# Patient Record
Sex: Female | Born: 1985 | Race: Black or African American | Hispanic: No | Marital: Married | Smoking: Former smoker
Health system: Southern US, Community
[De-identification: ages and names within clinical notes are randomized; demographics above are authoritative.]

## PROBLEM LIST (undated history)

## (undated) DIAGNOSIS — F419 Anxiety disorder, unspecified: Secondary | ICD-10-CM

## (undated) DIAGNOSIS — Z9289 Personal history of other medical treatment: Secondary | ICD-10-CM

## (undated) DIAGNOSIS — Z8659 Personal history of other mental and behavioral disorders: Secondary | ICD-10-CM

## (undated) DIAGNOSIS — M797 Fibromyalgia: Secondary | ICD-10-CM

## (undated) DIAGNOSIS — B977 Papillomavirus as the cause of diseases classified elsewhere: Secondary | ICD-10-CM

## (undated) HISTORY — DX: Personal history of other medical treatment: Z92.89

## (undated) HISTORY — PX: HERNIA REPAIR: SHX51

## (undated) HISTORY — DX: Personal history of other mental and behavioral disorders: Z86.59

## (undated) HISTORY — PX: WISDOM TOOTH EXTRACTION: SHX21

## (undated) HISTORY — PX: OVARIAN CYST REMOVAL: SHX89

---
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
## 2007-01-01 ENCOUNTER — Ambulatory Visit: Payer: Self-pay | Admitting: Cardiology

## 2007-01-07 ENCOUNTER — Ambulatory Visit: Payer: Self-pay

## 2007-01-08 ENCOUNTER — Emergency Department (HOSPITAL_COMMUNITY): Admission: EM | Admit: 2007-01-08 | Discharge: 2007-01-09 | Payer: Self-pay | Admitting: Emergency Medicine

## 2007-01-23 ENCOUNTER — Ambulatory Visit: Payer: Self-pay

## 2007-01-23 ENCOUNTER — Encounter: Payer: Self-pay | Admitting: Internal Medicine

## 2007-01-31 ENCOUNTER — Ambulatory Visit: Payer: Self-pay | Admitting: Internal Medicine

## 2007-01-31 LAB — CONVERTED CEMR LAB
BUN: 13 mg/dL (ref 6–23)
Basophils Absolute: 0 10*3/uL (ref 0.0–0.1)
Basophils Relative: 0.2 % (ref 0.0–1.0)
CO2: 30 meq/L (ref 19–32)
Calcium: 9.5 mg/dL (ref 8.4–10.5)
Chloride: 110 meq/L (ref 96–112)
Creatinine, Ser: 0.8 mg/dL (ref 0.4–1.2)
Eosinophils Absolute: 0.1 10*3/uL (ref 0.0–0.6)
Eosinophils Relative: 1 % (ref 0.0–5.0)
GFR calc Af Amer: 119 mL/min
GFR calc non Af Amer: 98 mL/min
Glucose, Bld: 76 mg/dL (ref 70–99)
HCT: 37.6 % (ref 36.0–46.0)
Hemoglobin: 12.5 g/dL (ref 12.0–15.0)
Lymphocytes Relative: 28.1 % (ref 12.0–46.0)
MCHC: 33.4 g/dL (ref 30.0–36.0)
MCV: 89.2 fL (ref 78.0–100.0)
Monocytes Absolute: 1.1 10*3/uL — ABNORMAL HIGH (ref 0.2–0.7)
Monocytes Relative: 13.9 % — ABNORMAL HIGH (ref 3.0–11.0)
Neutro Abs: 4.5 10*3/uL (ref 1.4–7.7)
Neutrophils Relative %: 56.8 % (ref 43.0–77.0)
Platelets: 257 10*3/uL (ref 150–400)
Potassium: 4.1 meq/L (ref 3.5–5.1)
RBC: 4.21 M/uL (ref 3.87–5.11)
RDW: 13.6 % (ref 11.5–14.6)
Sodium: 144 meq/L (ref 135–145)
WBC: 7.9 10*3/uL (ref 4.5–10.5)

## 2007-02-08 ENCOUNTER — Emergency Department (HOSPITAL_COMMUNITY): Admission: EM | Admit: 2007-02-08 | Discharge: 2007-02-08 | Payer: Self-pay | Admitting: Emergency Medicine

## 2007-02-08 ENCOUNTER — Ambulatory Visit: Payer: Self-pay | Admitting: Psychiatry

## 2007-02-08 ENCOUNTER — Inpatient Hospital Stay (HOSPITAL_COMMUNITY): Admission: EM | Admit: 2007-02-08 | Discharge: 2007-02-11 | Payer: Self-pay | Admitting: Psychiatry

## 2007-02-16 ENCOUNTER — Emergency Department (HOSPITAL_COMMUNITY): Admission: EM | Admit: 2007-02-16 | Discharge: 2007-02-16 | Payer: Self-pay | Admitting: Emergency Medicine

## 2007-03-23 ENCOUNTER — Emergency Department (HOSPITAL_COMMUNITY): Admission: EM | Admit: 2007-03-23 | Discharge: 2007-03-23 | Payer: Self-pay | Admitting: Emergency Medicine

## 2007-05-11 ENCOUNTER — Emergency Department (HOSPITAL_COMMUNITY): Admission: EM | Admit: 2007-05-11 | Discharge: 2007-05-11 | Payer: Self-pay | Admitting: Emergency Medicine

## 2007-05-20 ENCOUNTER — Emergency Department (HOSPITAL_COMMUNITY): Admission: EM | Admit: 2007-05-20 | Discharge: 2007-05-20 | Payer: Self-pay | Admitting: Emergency Medicine

## 2007-05-21 ENCOUNTER — Emergency Department (HOSPITAL_COMMUNITY): Admission: EM | Admit: 2007-05-21 | Discharge: 2007-05-22 | Payer: Self-pay | Admitting: Emergency Medicine

## 2007-05-23 ENCOUNTER — Telehealth: Payer: Self-pay | Admitting: Internal Medicine

## 2007-05-30 ENCOUNTER — Emergency Department (HOSPITAL_COMMUNITY): Admission: EM | Admit: 2007-05-30 | Discharge: 2007-05-30 | Payer: Self-pay | Admitting: Family Medicine

## 2007-07-01 ENCOUNTER — Telehealth: Payer: Self-pay | Admitting: Internal Medicine

## 2007-07-08 ENCOUNTER — Emergency Department (HOSPITAL_COMMUNITY): Admission: EM | Admit: 2007-07-08 | Discharge: 2007-07-08 | Payer: Self-pay | Admitting: Emergency Medicine

## 2007-07-08 ENCOUNTER — Telehealth: Payer: Self-pay | Admitting: Internal Medicine

## 2007-07-15 ENCOUNTER — Ambulatory Visit: Payer: Self-pay | Admitting: Cardiology

## 2007-07-22 ENCOUNTER — Encounter: Payer: Self-pay | Admitting: Cardiology

## 2007-07-22 ENCOUNTER — Ambulatory Visit: Payer: Self-pay

## 2007-08-14 ENCOUNTER — Inpatient Hospital Stay (HOSPITAL_COMMUNITY): Admission: AD | Admit: 2007-08-14 | Discharge: 2007-08-14 | Payer: Self-pay | Admitting: Family Medicine

## 2007-09-25 ENCOUNTER — Encounter: Payer: Self-pay | Admitting: Obstetrics & Gynecology

## 2007-09-25 ENCOUNTER — Ambulatory Visit: Payer: Self-pay | Admitting: Obstetrics & Gynecology

## 2007-09-25 ENCOUNTER — Encounter: Payer: Self-pay | Admitting: Internal Medicine

## 2007-10-01 ENCOUNTER — Ambulatory Visit (HOSPITAL_COMMUNITY): Admission: RE | Admit: 2007-10-01 | Discharge: 2007-10-01 | Payer: Self-pay | Admitting: Obstetrics & Gynecology

## 2007-10-23 DIAGNOSIS — Z9289 Personal history of other medical treatment: Secondary | ICD-10-CM

## 2007-10-23 HISTORY — DX: Personal history of other medical treatment: Z92.89

## 2007-11-06 ENCOUNTER — Ambulatory Visit: Payer: Self-pay | Admitting: Family Medicine

## 2007-11-06 ENCOUNTER — Encounter: Payer: Self-pay | Admitting: Internal Medicine

## 2007-12-12 ENCOUNTER — Emergency Department (HOSPITAL_COMMUNITY): Admission: EM | Admit: 2007-12-12 | Discharge: 2007-12-12 | Payer: Self-pay | Admitting: Emergency Medicine

## 2008-01-01 ENCOUNTER — Encounter: Payer: Self-pay | Admitting: Internal Medicine

## 2008-01-17 ENCOUNTER — Emergency Department (HOSPITAL_COMMUNITY): Admission: EM | Admit: 2008-01-17 | Discharge: 2008-01-17 | Payer: Self-pay | Admitting: Emergency Medicine

## 2008-03-05 ENCOUNTER — Telehealth: Payer: Self-pay | Admitting: Internal Medicine

## 2008-03-11 ENCOUNTER — Encounter: Payer: Self-pay | Admitting: Internal Medicine

## 2008-03-31 ENCOUNTER — Inpatient Hospital Stay (HOSPITAL_COMMUNITY): Admission: AD | Admit: 2008-03-31 | Discharge: 2008-03-31 | Payer: Self-pay | Admitting: Obstetrics & Gynecology

## 2008-04-08 ENCOUNTER — Emergency Department (HOSPITAL_COMMUNITY): Admission: EM | Admit: 2008-04-08 | Discharge: 2008-04-08 | Payer: Self-pay | Admitting: Family Medicine

## 2008-04-30 ENCOUNTER — Emergency Department (HOSPITAL_COMMUNITY): Admission: EM | Admit: 2008-04-30 | Discharge: 2008-04-30 | Payer: Self-pay | Admitting: Family Medicine

## 2008-06-05 ENCOUNTER — Emergency Department (HOSPITAL_COMMUNITY): Admission: EM | Admit: 2008-06-05 | Discharge: 2008-06-05 | Payer: Self-pay | Admitting: Family Medicine

## 2008-06-09 ENCOUNTER — Encounter: Admission: RE | Admit: 2008-06-09 | Discharge: 2008-06-09 | Payer: Self-pay | Admitting: Internal Medicine

## 2008-06-17 ENCOUNTER — Ambulatory Visit: Payer: Self-pay | Admitting: Cardiology

## 2008-07-31 ENCOUNTER — Emergency Department (HOSPITAL_BASED_OUTPATIENT_CLINIC_OR_DEPARTMENT_OTHER): Admission: EM | Admit: 2008-07-31 | Discharge: 2008-08-01 | Payer: Self-pay | Admitting: Emergency Medicine

## 2008-09-01 ENCOUNTER — Emergency Department (HOSPITAL_BASED_OUTPATIENT_CLINIC_OR_DEPARTMENT_OTHER): Admission: EM | Admit: 2008-09-01 | Discharge: 2008-09-01 | Payer: Self-pay | Admitting: Emergency Medicine

## 2008-12-01 ENCOUNTER — Ambulatory Visit: Payer: Self-pay | Admitting: Obstetrics and Gynecology

## 2008-12-13 ENCOUNTER — Encounter: Payer: Self-pay | Admitting: Family Medicine

## 2008-12-13 ENCOUNTER — Ambulatory Visit (HOSPITAL_COMMUNITY): Admission: RE | Admit: 2008-12-13 | Discharge: 2008-12-13 | Payer: Self-pay | Admitting: Family Medicine

## 2008-12-13 ENCOUNTER — Ambulatory Visit: Payer: Self-pay | Admitting: Family Medicine

## 2008-12-31 ENCOUNTER — Ambulatory Visit: Payer: Self-pay | Admitting: Obstetrics & Gynecology

## 2009-01-20 ENCOUNTER — Emergency Department (HOSPITAL_COMMUNITY): Admission: EM | Admit: 2009-01-20 | Discharge: 2009-01-21 | Payer: Self-pay | Admitting: Emergency Medicine

## 2009-01-26 ENCOUNTER — Ambulatory Visit: Payer: Self-pay | Admitting: Diagnostic Radiology

## 2009-01-26 ENCOUNTER — Emergency Department (HOSPITAL_BASED_OUTPATIENT_CLINIC_OR_DEPARTMENT_OTHER): Admission: EM | Admit: 2009-01-26 | Discharge: 2009-01-26 | Payer: Self-pay | Admitting: Emergency Medicine

## 2009-02-10 ENCOUNTER — Ambulatory Visit: Payer: Self-pay | Admitting: Family Medicine

## 2009-03-08 ENCOUNTER — Ambulatory Visit (HOSPITAL_COMMUNITY): Admission: RE | Admit: 2009-03-08 | Discharge: 2009-03-08 | Payer: Self-pay | Admitting: Family Medicine

## 2009-03-10 ENCOUNTER — Ambulatory Visit: Payer: Self-pay | Admitting: Obstetrics and Gynecology

## 2009-04-01 IMAGING — CR DG CHEST 1V PORT
1 series · 1 of 1 positions shown · non-contrast
Comparison: 12/28/06.

CLINICAL DATA: Chest pain.  
 PORTABLE CHEST:

[view not recorded]
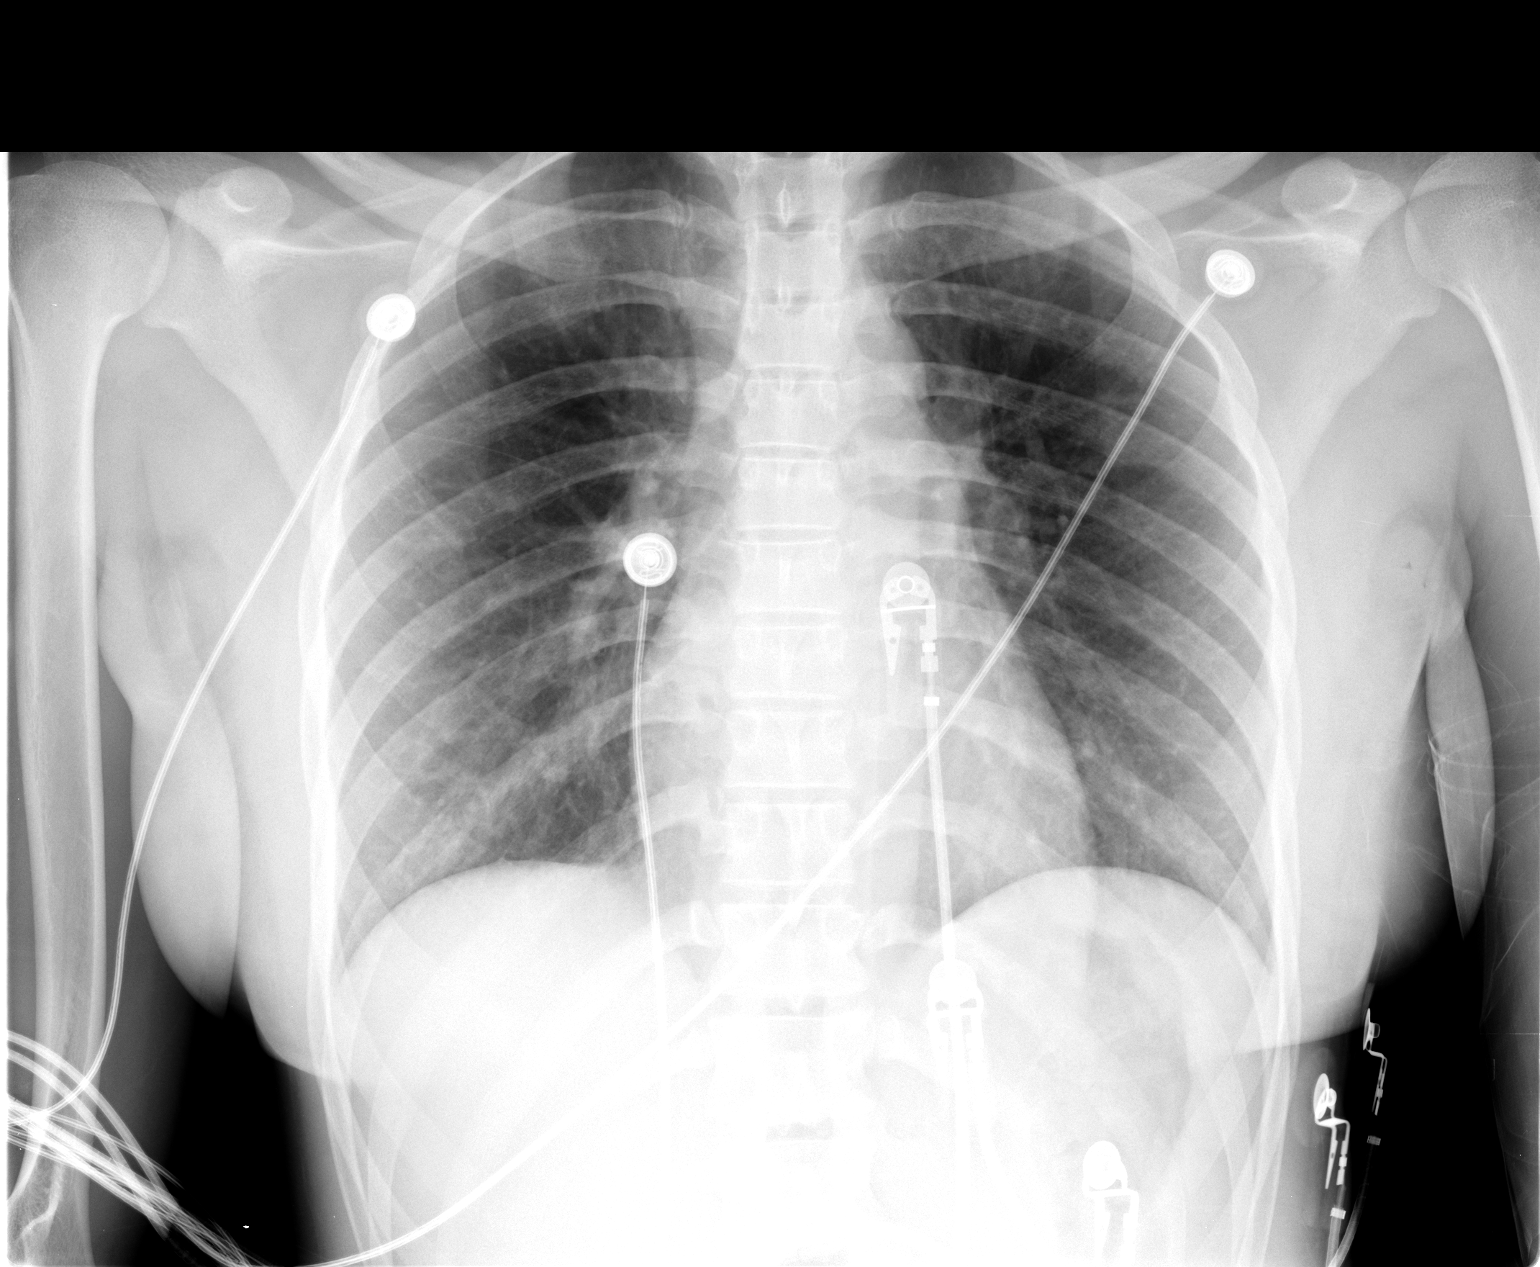

[1 of 1 positions shown; findings below may reference images not displayed]

FINDINGS: Heart size and mediastinal contours are normal.  Both lungs are clear.  There is no evidence of pleural effusion.  No mass or adenopathy identified.
IMPRESSION: No active disease.

## 2009-05-24 ENCOUNTER — Emergency Department (HOSPITAL_COMMUNITY): Admission: EM | Admit: 2009-05-24 | Discharge: 2009-05-25 | Payer: Self-pay | Admitting: Emergency Medicine

## 2009-05-31 ENCOUNTER — Emergency Department (HOSPITAL_BASED_OUTPATIENT_CLINIC_OR_DEPARTMENT_OTHER): Admission: EM | Admit: 2009-05-31 | Discharge: 2009-05-31 | Payer: Self-pay | Admitting: Emergency Medicine

## 2009-06-21 ENCOUNTER — Ambulatory Visit (HOSPITAL_COMMUNITY): Admission: RE | Admit: 2009-06-21 | Discharge: 2009-06-21 | Payer: Self-pay | Admitting: Family Medicine

## 2009-09-02 ENCOUNTER — Emergency Department (HOSPITAL_COMMUNITY): Admission: EM | Admit: 2009-09-02 | Discharge: 2009-09-02 | Payer: Self-pay | Admitting: Emergency Medicine

## 2009-09-14 ENCOUNTER — Emergency Department (HOSPITAL_COMMUNITY): Admission: EM | Admit: 2009-09-14 | Discharge: 2009-09-15 | Payer: Self-pay | Admitting: Emergency Medicine

## 2010-01-08 ENCOUNTER — Ambulatory Visit: Payer: Self-pay | Admitting: Physician Assistant

## 2010-01-08 ENCOUNTER — Inpatient Hospital Stay (HOSPITAL_COMMUNITY): Admission: AD | Admit: 2010-01-08 | Discharge: 2010-01-09 | Payer: Self-pay | Admitting: Family Medicine

## 2010-01-20 ENCOUNTER — Emergency Department (HOSPITAL_COMMUNITY): Admission: EM | Admit: 2010-01-20 | Discharge: 2010-01-20 | Payer: Self-pay | Admitting: Emergency Medicine

## 2010-01-25 ENCOUNTER — Emergency Department (HOSPITAL_COMMUNITY): Admission: EM | Admit: 2010-01-25 | Discharge: 2010-01-26 | Payer: Self-pay | Admitting: Emergency Medicine

## 2010-01-28 ENCOUNTER — Emergency Department (HOSPITAL_COMMUNITY): Admission: EM | Admit: 2010-01-28 | Discharge: 2010-01-28 | Payer: Self-pay | Admitting: Emergency Medicine

## 2010-02-22 ENCOUNTER — Emergency Department (HOSPITAL_COMMUNITY): Admission: EM | Admit: 2010-02-22 | Discharge: 2010-02-23 | Payer: Self-pay | Admitting: Emergency Medicine

## 2010-04-22 ENCOUNTER — Emergency Department (HOSPITAL_BASED_OUTPATIENT_CLINIC_OR_DEPARTMENT_OTHER): Admission: EM | Admit: 2010-04-22 | Discharge: 2010-04-22 | Payer: Self-pay | Admitting: Emergency Medicine

## 2010-04-24 ENCOUNTER — Emergency Department (HOSPITAL_BASED_OUTPATIENT_CLINIC_OR_DEPARTMENT_OTHER): Admission: EM | Admit: 2010-04-24 | Discharge: 2010-04-24 | Payer: Self-pay | Admitting: Emergency Medicine

## 2010-04-26 ENCOUNTER — Emergency Department (HOSPITAL_BASED_OUTPATIENT_CLINIC_OR_DEPARTMENT_OTHER): Admission: EM | Admit: 2010-04-26 | Discharge: 2010-04-26 | Payer: Self-pay | Admitting: Emergency Medicine

## 2010-04-29 ENCOUNTER — Ambulatory Visit: Payer: Self-pay | Admitting: Nurse Practitioner

## 2010-04-29 ENCOUNTER — Inpatient Hospital Stay (HOSPITAL_COMMUNITY): Admission: AD | Admit: 2010-04-29 | Discharge: 2010-04-29 | Payer: Self-pay | Admitting: Obstetrics and Gynecology

## 2010-07-26 ENCOUNTER — Inpatient Hospital Stay (HOSPITAL_COMMUNITY): Admission: AD | Admit: 2010-07-26 | Discharge: 2010-07-26 | Payer: Self-pay | Admitting: Obstetrics & Gynecology

## 2010-08-12 ENCOUNTER — Emergency Department (HOSPITAL_COMMUNITY): Admission: EM | Admit: 2010-08-12 | Discharge: 2010-08-12 | Payer: Self-pay | Admitting: Emergency Medicine

## 2010-09-09 ENCOUNTER — Emergency Department (HOSPITAL_COMMUNITY): Admission: EM | Admit: 2010-09-09 | Discharge: 2010-09-09 | Payer: Self-pay | Admitting: Family Medicine

## 2010-10-02 ENCOUNTER — Ambulatory Visit: Payer: Self-pay | Admitting: Obstetrics and Gynecology

## 2010-10-29 ENCOUNTER — Inpatient Hospital Stay (HOSPITAL_COMMUNITY)
Admission: AD | Admit: 2010-10-29 | Discharge: 2010-10-29 | Payer: Self-pay | Source: Home / Self Care | Attending: Obstetrics & Gynecology | Admitting: Obstetrics & Gynecology

## 2010-11-06 ENCOUNTER — Emergency Department (HOSPITAL_COMMUNITY)
Admission: EM | Admit: 2010-11-06 | Discharge: 2010-11-06 | Payer: Self-pay | Source: Home / Self Care | Admitting: Emergency Medicine

## 2010-11-06 LAB — WET PREP, GENITAL
Clue Cells Wet Prep HPF POC: NONE SEEN
Trich, Wet Prep: NONE SEEN
Yeast Wet Prep HPF POC: NONE SEEN

## 2010-11-06 LAB — POCT PREGNANCY, URINE: Preg Test, Ur: NEGATIVE

## 2010-11-06 LAB — GC/CHLAMYDIA PROBE AMP, GENITAL
Chlamydia, DNA Probe: NEGATIVE
GC Probe Amp, Genital: NEGATIVE

## 2010-11-13 ENCOUNTER — Encounter: Payer: Self-pay | Admitting: *Deleted

## 2010-12-08 ENCOUNTER — Encounter: Payer: Self-pay | Admitting: Advanced Practice Midwife

## 2010-12-21 ENCOUNTER — Other Ambulatory Visit: Payer: Self-pay | Admitting: Obstetrics & Gynecology

## 2011-01-02 LAB — GC/CHLAMYDIA PROBE AMP, GENITAL
Chlamydia, DNA Probe: NEGATIVE
GC Probe Amp, Genital: NEGATIVE

## 2011-01-02 LAB — WET PREP, GENITAL
Trich, Wet Prep: NONE SEEN
Yeast Wet Prep HPF POC: NONE SEEN

## 2011-01-02 LAB — POCT PREGNANCY, URINE: Preg Test, Ur: NEGATIVE

## 2011-01-02 LAB — POCT URINALYSIS DIPSTICK
Bilirubin Urine: NEGATIVE
Ketones, ur: NEGATIVE mg/dL
Specific Gravity, Urine: 1.025 (ref 1.005–1.030)

## 2011-01-03 LAB — WET PREP, GENITAL: Trich, Wet Prep: NONE SEEN

## 2011-01-03 LAB — CBC
HCT: 33.8 % — ABNORMAL LOW (ref 36.0–46.0)
Hemoglobin: 11.4 g/dL — ABNORMAL LOW (ref 12.0–15.0)
MCH: 29.2 pg (ref 26.0–34.0)
MCHC: 33.7 g/dL (ref 30.0–36.0)
RDW: 13.7 % (ref 11.5–15.5)

## 2011-01-03 LAB — URINALYSIS, ROUTINE W REFLEX MICROSCOPIC
Bilirubin Urine: NEGATIVE
Glucose, UA: NEGATIVE mg/dL
Hgb urine dipstick: NEGATIVE
Ketones, ur: NEGATIVE mg/dL
pH: 6.5 (ref 5.0–8.0)

## 2011-01-03 LAB — BASIC METABOLIC PANEL
BUN: 11 mg/dL (ref 6–23)
CO2: 24 mEq/L (ref 19–32)
Calcium: 8.9 mg/dL (ref 8.4–10.5)
Glucose, Bld: 91 mg/dL (ref 70–99)
Sodium: 137 mEq/L (ref 135–145)

## 2011-01-03 LAB — URINE MICROSCOPIC-ADD ON

## 2011-01-03 LAB — DIFFERENTIAL
Basophils Relative: 1 % (ref 0–1)
Eosinophils Absolute: 0.2 10*3/uL (ref 0.0–0.7)
Monocytes Absolute: 1.1 10*3/uL — ABNORMAL HIGH (ref 0.1–1.0)
Monocytes Relative: 13 % — ABNORMAL HIGH (ref 3–12)
Neutrophils Relative %: 58 % (ref 43–77)

## 2011-01-03 LAB — POCT PREGNANCY, URINE: Preg Test, Ur: NEGATIVE

## 2011-01-04 LAB — CBC
HCT: 32.9 % — ABNORMAL LOW (ref 36.0–46.0)
MCHC: 34 g/dL (ref 30.0–36.0)
MCV: 90.6 fL (ref 78.0–100.0)
RDW: 14.4 % (ref 11.5–15.5)

## 2011-01-04 LAB — URINALYSIS, ROUTINE W REFLEX MICROSCOPIC
Bilirubin Urine: NEGATIVE
Hgb urine dipstick: NEGATIVE
Ketones, ur: NEGATIVE mg/dL
Protein, ur: NEGATIVE mg/dL
Urobilinogen, UA: 0.2 mg/dL (ref 0.0–1.0)

## 2011-01-04 LAB — WET PREP, GENITAL: Trich, Wet Prep: NONE SEEN

## 2011-01-07 LAB — DIFFERENTIAL
Eosinophils Absolute: 0.2 10*3/uL (ref 0.0–0.7)
Eosinophils Relative: 2 % (ref 0–5)
Lymphs Abs: 2.8 10*3/uL (ref 0.7–4.0)
Monocytes Relative: 9 % (ref 3–12)

## 2011-01-07 LAB — CBC
Hemoglobin: 12.1 g/dL (ref 12.0–15.0)
MCV: 90.6 fL (ref 78.0–100.0)
Platelets: 250 10*3/uL (ref 150–400)
RBC: 3.98 MIL/uL (ref 3.87–5.11)
WBC: 6.9 10*3/uL (ref 4.0–10.5)

## 2011-01-07 LAB — URINALYSIS, ROUTINE W REFLEX MICROSCOPIC
Bilirubin Urine: NEGATIVE
Glucose, UA: NEGATIVE mg/dL
Ketones, ur: 15 mg/dL — AB
Nitrite: NEGATIVE
Specific Gravity, Urine: 1.025 (ref 1.005–1.030)
pH: 6 (ref 5.0–8.0)

## 2011-01-07 LAB — URINE MICROSCOPIC-ADD ON

## 2011-01-07 LAB — WET PREP, GENITAL
Clue Cells Wet Prep HPF POC: NONE SEEN
Trich, Wet Prep: NONE SEEN

## 2011-01-07 LAB — GC/CHLAMYDIA PROBE AMP, GENITAL: GC Probe Amp, Genital: NEGATIVE

## 2011-01-07 LAB — RPR: RPR Ser Ql: NONREACTIVE

## 2011-01-14 LAB — ABO/RH: ABO/RH(D): O POS

## 2011-01-14 LAB — HCG, QUANTITATIVE, PREGNANCY: hCG, Beta Chain, Quant, S: <2 m[IU]/mL (ref <5)

## 2011-01-14 LAB — WET PREP, GENITAL: Trich, Wet Prep: NONE SEEN

## 2011-01-14 LAB — CBC
RBC: 4.14 MIL/uL (ref 3.87–5.11)
WBC: 7.4 10*3/uL (ref 4.0–10.5)

## 2011-01-24 LAB — POCT I-STAT, CHEM 8
Chloride: 106 mEq/L (ref 96–112)
Glucose, Bld: 91 mg/dL (ref 70–99)
HCT: 38 % (ref 36.0–46.0)
Potassium: 3.5 mEq/L (ref 3.5–5.1)

## 2011-01-24 LAB — CBC
MCHC: 34.2 g/dL (ref 30.0–36.0)
RBC: 4.02 MIL/uL (ref 3.87–5.11)
WBC: 5.1 10*3/uL (ref 4.0–10.5)

## 2011-01-24 LAB — DIFFERENTIAL
Basophils Relative: 1 % (ref 0–1)
Monocytes Relative: 19 % — ABNORMAL HIGH (ref 3–12)
Neutro Abs: 2.4 10*3/uL (ref 1.7–7.7)
Neutrophils Relative %: 47 % (ref 43–77)

## 2011-01-24 LAB — URINALYSIS, ROUTINE W REFLEX MICROSCOPIC
Nitrite: NEGATIVE
Specific Gravity, Urine: 1.021 (ref 1.005–1.030)
Urobilinogen, UA: 1 mg/dL (ref 0.0–1.0)

## 2011-01-24 LAB — POCT PREGNANCY, URINE: Preg Test, Ur: NEGATIVE

## 2011-01-26 ENCOUNTER — Inpatient Hospital Stay (INDEPENDENT_AMBULATORY_CARE_PROVIDER_SITE_OTHER)
Admission: RE | Admit: 2011-01-26 | Discharge: 2011-01-26 | Disposition: A | Discharge status: Home / Self Care | Payer: Self-pay | Source: Ambulatory Visit | Attending: Family Medicine | Admitting: Family Medicine

## 2011-01-26 DIAGNOSIS — L03818 Cellulitis of other sites: Secondary | ICD-10-CM

## 2011-01-27 LAB — COMPREHENSIVE METABOLIC PANEL
AST: 25 U/L (ref 0–37)
Albumin: 4.4 g/dL (ref 3.5–5.2)
Calcium: 9.8 mg/dL (ref 8.4–10.5)
Creatinine, Ser: 0.8 mg/dL (ref 0.4–1.2)
GFR calc Af Amer: >60 mL/min (ref >60)
Total Protein: 8.3 g/dL (ref 6.0–8.3)

## 2011-01-27 LAB — URINALYSIS, ROUTINE W REFLEX MICROSCOPIC
Glucose, UA: NEGATIVE mg/dL
Hgb urine dipstick: NEGATIVE
Leukocytes, UA: NEGATIVE
Nitrite: NEGATIVE
Specific Gravity, Urine: 1.03 (ref 1.005–1.030)
Specific Gravity, Urine: 1.034 — ABNORMAL HIGH (ref 1.005–1.030)
Urobilinogen, UA: 1 mg/dL (ref 0.0–1.0)

## 2011-01-27 LAB — URINE MICROSCOPIC-ADD ON

## 2011-01-27 LAB — POCT PREGNANCY, URINE: Preg Test, Ur: NEGATIVE

## 2011-01-31 LAB — DIFFERENTIAL
Basophils Absolute: 0 10*3/uL (ref 0.0–0.1)
Basophils Relative: 1 % (ref 0–1)
Basophils Relative: 1 % (ref 0–1)
Eosinophils Absolute: 0.1 10*3/uL (ref 0.0–0.7)
Eosinophils Absolute: 0.1 10*3/uL (ref 0.0–0.7)
Eosinophils Relative: 2 % (ref 0–5)
Lymphs Abs: 2.3 10*3/uL (ref 0.7–4.0)
Monocytes Absolute: 0.8 10*3/uL (ref 0.1–1.0)
Monocytes Relative: 14 % — ABNORMAL HIGH (ref 3–12)
Neutrophils Relative %: 51 % (ref 43–77)

## 2011-01-31 LAB — BASIC METABOLIC PANEL
BUN: 13 mg/dL (ref 6–23)
Chloride: 105 mEq/L (ref 96–112)
Creatinine, Ser: 0.86 mg/dL (ref 0.4–1.2)
Glucose, Bld: 84 mg/dL (ref 70–99)

## 2011-01-31 LAB — COMPREHENSIVE METABOLIC PANEL
ALT: 7 U/L (ref 0–35)
AST: 28 U/L (ref 0–37)
Albumin: 3.7 g/dL (ref 3.5–5.2)
Alkaline Phosphatase: 47 U/L (ref 39–117)
BUN: 14 mg/dL (ref 6–23)
CO2: 25 mEq/L (ref 19–32)
Calcium: 8.9 mg/dL (ref 8.4–10.5)
Chloride: 107 mEq/L (ref 96–112)
Creatinine, Ser: 0.9 mg/dL (ref 0.4–1.2)
GFR calc Af Amer: >60 mL/min (ref >60)
GFR calc non Af Amer: >60 mL/min (ref >60)
Glucose, Bld: 77 mg/dL (ref 70–99)
Potassium: 3.8 mEq/L (ref 3.5–5.1)
Sodium: 140 mEq/L (ref 135–145)
Total Bilirubin: 0.3 mg/dL (ref 0.3–1.2)
Total Protein: 6.9 g/dL (ref 6.0–8.3)

## 2011-01-31 LAB — URINALYSIS, ROUTINE W REFLEX MICROSCOPIC
Bilirubin Urine: NEGATIVE
Glucose, UA: NEGATIVE mg/dL
Glucose, UA: NEGATIVE mg/dL
Hgb urine dipstick: NEGATIVE
Hgb urine dipstick: NEGATIVE
Protein, ur: NEGATIVE mg/dL
Specific Gravity, Urine: 1.027 (ref 1.005–1.030)
Specific Gravity, Urine: 1.029 (ref 1.005–1.030)
pH: 7 (ref 5.0–8.0)
pH: 7.5 (ref 5.0–8.0)

## 2011-01-31 LAB — CBC
MCV: 92.4 fL (ref 78.0–100.0)
Platelets: 169 10*3/uL (ref 150–400)
Platelets: 187 10*3/uL (ref 150–400)
RDW: 12.6 % (ref 11.5–15.5)
RDW: 13.5 % (ref 11.5–15.5)
WBC: 6.3 10*3/uL (ref 4.0–10.5)

## 2011-01-31 LAB — GC/CHLAMYDIA PROBE AMP, GENITAL: GC Probe Amp, Genital: NEGATIVE

## 2011-01-31 LAB — WET PREP, GENITAL
Trich, Wet Prep: NONE SEEN
Yeast Wet Prep HPF POC: NONE SEEN

## 2011-01-31 LAB — POCT PREGNANCY, URINE: Preg Test, Ur: NEGATIVE

## 2011-01-31 LAB — PREGNANCY, URINE: Preg Test, Ur: NEGATIVE

## 2011-02-06 LAB — CBC
Hemoglobin: 13.3 g/dL (ref 12.0–15.0)
MCHC: 33.7 g/dL (ref 30.0–36.0)
RDW: 14.5 % (ref 11.5–15.5)

## 2011-02-06 LAB — PREGNANCY, URINE: Preg Test, Ur: NEGATIVE

## 2011-03-06 NOTE — Assessment & Plan Note (Signed)
[facility]                            CARDIOLOGY OFFICE NOTE   [name], [name]                      MRN:          [id number]  DATE:06/17/2008                            DOB:          28-Sep-1986    [name] is a 25 year old female who has previously been seen by Dr.  Diona [name] for chest pain.  Dr. Diona [name] last saw her on July 15, 2007.  She apparently saw him in the past for chest pain, palpitations,  and syncope.  She had an echocardiogram performed for the above reasons  on January 23, 2007.  Her LV function was normal.  There is trivial mitral  regurgitation.  She also apparently had a monitor placed.  The  outpatient monitor showed no significant arrhythmias.  She also  apparently had orthostatic symptoms, but did not have orthostasis  documented in the office on her previous report.  Finally, she had a  stress echocardiogram performed on July 22, 2007.  She exercised  for 6 minutes and 50 seconds on the Bruce protocol.  There was no chest  pain during the study.  There were no arrhythmias noted.  There was no  stress-induced wall motion abnormalities.  She has continued to have  chest pain and asked to be seen for this particular problem today.  She  in particularly, had more pain in the past one and a half weeks.  She  states that it has been continuous without ever completely resolving at  that time.  It is substernal in location.  It is described as a sharp  pain followed by dull sensation.  The pain can increase with exertion,  but it is always there.  It can also increase with lying flat.  It is  not pleuritic.  There is no associated nausea, vomiting or diaphoresis.  She does feel short of breath at times with it.  Because of the above,  she wanted to be seen for further evaluation.  Note, she also has  dyspnea on exertion, but attributes this to her asthma.  There is no  orthopnea, PND or pedal edema.  She has not had  syncope.   MEDICATIONS:  Albuterol as needed.   She has no known drug allergies.   SOCIAL HISTORY:  She does smoke.  She occasionally consumes alcohol.  She denies any drug use.   FAMILY HISTORY:  Positive coronary disease by her report with multiple  members.   PAST MEDICAL HISTORY:  There is no diabetes mellitus, hypertension, or  hyperlipidemia.  She had a previous hernia repair.  She does have a  history of depression.   REVIEW OF SYSTEMS:  She denies any headaches, fevers or chills.  She  does occasionally have a cough.  There is no hemoptysis.  There is no  dysphagia, odynophagia, melena, or hematochezia.  She does state that  she has had diarrhea recently.  There is no dysuria or hematuria.  She  states that her menstrual cycles have increased.  There is no orthopnea,  PND or pedal edema.  There is no claudication noted.  Remaining systems  are negative.   PHYSICAL EXAMINATION:  VITAL SIGNS:  Today shows a blood pressure of  115/80 and her pulse is 64.  She weighs 126 pounds.  GENERAL:  She is well-developed, well-nourished, in no acute distress.  SKIN:  Warm and dry.  She does not appear depressed.  There is no  peripheral clubbing.  BACK:  Normal.  HEENT:  Normal with normal eyelids.  NECK:  Supple with a normal upstroke bilaterally.  No bruits noted.  There is no jugular venous distention.  I cannot appreciate thyromegaly.  CHEST:  Clear to auscultation.  There is no expansion.  CARDIOVASCULAR:  Regular rhythm.  Normal S1 and S2.  There are no  murmurs, rubs or gallops noted.  ABDOMEN:  Nontender, and distended.  Positive bowel sounds.  No  hepatosplenomegaly, no mass appreciated.  There is no abdominal bruit.  She has 2+ femoral pulses bilaterally, no bruits.  EXTREMITIES:  No edema palpating the cords.  She has 2+ posterior tibial  pulses bilaterally.  NEUROLOGY:  Grossly intact.   Her electrocardiogram shows a sinus rhythm at a rate of 64.  There are  no ST  changes noted.   DIAGNOSES:  1. Chest pain - The patient's symptoms are not consistent with a      cardiac etiology.  They have been persistent for the past 8 days      without ever completely resolving.  Her electrocardiogram is      normal.  She had a stress echocardiogram performed in September      2008, that was normal.  I do not think, we need to pursue further      cardiac workup at this point.  Some of her pain may be      musculoskeletal in etiology.  I have asked her to follow up with      Dr. Celene Skeen for this issue.  2. Tobacco abuse - We discussed the importance of discontinuing this,      for between 3-10 minutes.  3. History of asthma - She will continue on her albuterol.   We will see her back on an as-needed basis.     Madolyn Frieze Jens Som, MD, [facility]  Electronically Signed    BSC/MedQ  DD: 06/17/2008  DT: 06/18/2008  Job #: [id number]   cc:   Charlesetta Shanks

## 2011-03-06 NOTE — Group Therapy Note (Signed)
[name], [name]               ACCOUNT NO.:  [id number]   MEDICAL RECORD NO.:  [id number]          PATIENT TYPE:  WOC   LOCATION:  [facility]                   FACILITY:  [facility]   PHYSICIAN:  Caren Griffins, CNM       DATE OF BIRTH:  May 21, 1986   DATE OF SERVICE:  03/10/2009                                  CLINIC NOTE   REASON FOR VISIT:  Here for results.   HISTORY:  This is a 25 year old G1, 0-0-1-0 who has been followed here  for left pelvic pain for which she was initially evaluated at [facility]  ED when her CT showed a left ovarian cyst.  Most recently she was seen  here by Dr. Noel Gerold about a month ago and was scheduled for her follow-up  of the ovarian cyst which was done 2 days ago.  In addition, she was  given Flagyl for BV and started on Sprintec.  Since that visit, she  continues to have left pelvic pain of varying intensity.  She does not  have  irritative vaginal symptoms and she does not have significant  mastodynia anymore.  She relates that her LMP was May, 18, 2010 which  was the correct time for her withdrawal bleed on Sprintec.  Her previous  normal period was September 03, 2009.  In fact, she states she had had a  pattern for around a year and a half of having menorrhagia followed by  about 6 weeks of no bleeding.  Of note, she had a hemoglobin of 12.6 on  January 26, 2009.  She had a normal TSH and normal CMP.  Her ultrasound on  Mar 08, 2009 shows a simple appearing cyst of the left ovary similar in  size  when compared with the prior CT scan, appears physiologic.  The  recommendation is follow up in 6-8 weeks to document resolution of the  cyst.   ASSESSMENT:  Pelvic pain related to left ovarian cyst.  Pain not  improved on  Sprintec.   PLAN:  She is advised to keep a menstrual calendar and continue on her  Sprintec.  She is given a prescription for Vicodin 5-500.  She is to use  1 or 2 p.r.n. q.4-6 hours as necessary for breakthrough pain, if  ibuprofen 800  alternating with Tylenol 650 is ineffective.  She will be  scheduled for follow-up ultrasound in about 7 weeks to look for  resolution of her left ovarian cyst and we will see her again on that  day.           ______________________________  Caren Griffins, CNM     DP/MEDQ  D:  03/10/2009  T:  03/10/2009  Job:  [id number]

## 2011-03-06 NOTE — Op Note (Signed)
[name], [name]               ACCOUNT NO.:  [id number]   MEDICAL RECORD NO.:  [id number]          PATIENT TYPE:  AMB   LOCATION:  [facility]                           [facility]   PHYSICIAN:  Tanya S. Shawnie Pons, M.D.   DATE OF BIRTH:  1986-06-19   DATE OF PROCEDURE:  12/13/2008  DATE OF DISCHARGE:                               OPERATIVE REPORT   PREOPERATIVE DIAGNOSIS:  Cervical spotting plus or minus cervical  lesion.   POSTOPERATIVE DIAGNOSIS:  Cervical spotting plus or minus cervical  lesion.   SURGEON:  Shelbie Proctor. Shawnie Pons, MD   ASSISTANT:  None.   ANESTHESIA:  MAC and local.   FINDINGS:  Large ectropion.   SPECIMENS:  Cervical lead to pathology.   ESTIMATED BLOOD LOSS:  Minimal.   COMPLICATIONS:  None known.   REASON FOR PROCEDURE:  Briefly, the patient is a 25 year old nullipara  who has been seen in our office at least 2 different times with chronic  vaginal spotting.  She is noted to have a large ectropion which is very  friable and seems to bleed quite a bit.  This lesion was again noted.  At the time of her [facility], [facility],  she is referred over again.  Her Pap smear from that visit is noted to  be normal.  However, given that she continues to have problems with  continued bleeding, it was felt maybe the patient would benefit from  lead to see if this helps the ectropion heal better and become less  friable.   PROCEDURE:  The patient was taken to the OR.  She was placed in dorsal  lithotomy in [place] stirrups.  She was prepped and draped in the usual  sterile fashion.  A speculum was placed.  Cervix was visualized.  There  was a large ectropion noted that it was not just posterior, but on the  anterior edge of the cervix as well.  It was friable, did bleed with  contact with the speculum.  After application of acetic acid, it really  was not very much in the way of acetowhite areas noted, but given her  continued problems of  bleeding, it was felt, the patient best to be  served by LEEP procedure.  The speculum was then removed and a Teflon-  coated speculum was placed inside the cervix.  Suction was hooked up and  a cervical block was done with 20 mL of lidocaine with epinephrine.  Once this was completed a medium-sized apple fissure cone was used to  take a LEEP specimen.  Once this was removed, the bed  of the LEEP was cauterized with electrocautery ball.  Excellent  hemostasis was noted but Monsel was placed intraoperatively.  Again,  excellent hemostasis was noted and all instruments were removed from the  vagina.  All instrument and lap counts were x2.  The patient was  awakened and taken to recovery room in stable condition.      Shelbie Proctor. Shawnie Pons, M.D.  Electronically Signed     TSP/MEDQ  D:  12/13/2008  T:  12/14/2008  Job:  [id number]
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
## 2011-03-06 NOTE — Assessment & Plan Note (Signed)
[facility]                            CARDIOLOGY OFFICE NOTE   LANDIS, CASSARO                      MRN:          [id number]  DATE:07/15/2007                            DOB:          04/30/1986    PRIMARY CARE PHYSICIAN:  Dr. Berniece Andreas.   HISTORY OF PRESENT ILLNESS:  I saw [name] in consultation back in  March of this year.  Please see my full consultation note for details.  She had had symptoms of atypical chest pain as well as intermittent  syncope and a reported problem with anxiety and palpitations.  Her  baseline electrocardiogram reveals no marked abnormalities.  I referred  her for an echocardiogram which was performed in April of this year  demonstrating normal left ventricular systolic function and no major  valvular abnormalities.  I also referred her for an outpatient cardiac  monitor.  With symptoms such as lightheadedness and chest pain, no  significant arrhythmias were noted.  It was suggested that she come back  for a followup, although she was a no-show to the clinic.  I have not  seen her since that time.   [name] is now referred back to the office with continued similar  symptomatology.  She describes intermittent unpredictable sharp chest  pains.  These are not clearly exertional.  She has not had frank syncope  but continues to report a feeling of lightheadedness sometimes when she  stands up.  We had orthostatic measurements made in the office today  which did not reveal any significant orthostasis.  Her heart rate  increased somewhat from 57 to 72 going from lying supine to standing  position after 5 minutes, although her systolic blood pressure went from  122 to 130.  She did report a feeling of dizziness and black spots in  her eyes while she was standing despite no significant orthostatic  change.  Her electrocardiogram today shows sinus rhythm with R primes in  leads V1 and V2, as noted previously, as well as  nonspecific anterior T  wave changes.  She does have some T wave inversions in lead V3 through  V5 which are new in comparison to the previous tracing from March.  I  note that the patient was admitted back in April with suicidal thoughts.  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  She tells me that she is scheduled to see a  psychologist in the next week.   I had a discussion with Ms. Dehner today.  It is very unlikely that she  has a primary cardiac etiology for her symptoms.  We have not as yet  documented any significant dysrhythmias.  She could certainly have a  neurocardiogenic mechanism to her syncope, in fact, this would be the  most common reason for this.  Structurally, her heart was normal by  echocardiography.  She has not had frank ischemic evaluation, although  this would be very unusual in a 25 year old woman.  We did talk about  doing a basic exercise treadmill test to  clear the air on this issue.  I  suspect that her EKG changes are nonspecific.   ALLERGIES:  No known drug allergies.   PRESENT MEDICATIONS:  Lamictal 25 mg p.o. daily.   REVIEW OF SYSTEMS:  As described in the History of Present Illness.   EXAMINATION:  Blood pressure 98/70, heart rate 59, weight 127 pounds,  otherwise orthostatic measurements as noted in the chart.  A normally  nourished appearing woman in no acute distress.  HEENT:  Normal.  NECK:  Supple.  No elevated jugular venous pressure, no loud bruits, no  thyromegaly.  LUNGS:  Clear.  Nonlabored breathing.  CARDIAC EXAM:  A regular rate and rhythm.  No loud murmur or S3 gallop.  ABDOMEN:  Soft, normal bowel sounds, no tenderness.  EXTREMITIES:  No pitting edema.  Skin warm and dry.  Distal pulses 2+.  MUSCULOSKELETAL:  No kyphosis is noted.  NEURO/PSYCHIATRIC:  The patient is alert and oriented x3, affect seems  appropriate today.   IMPRESSION AND RECOMMENDATIONS:  1. Atypical  chest pain, intermittent dizziness, and occasional syncope      as outlined previously.  So far cardiac assessment has been      reassuring including a structurally normal echocardiogram and no      specific dysrhythmias documented with cardiac monitoring in the      setting of symptomatology.  The patient is also not noted to be      orthostatic, also complaining of dizziness when she stands.  I      think the likelihood of underlying ischemia is quite low in this 25-      year-old woman.  She does have some T wave changes in the      anterolateral leads which are likely nonspecific.  We talked about      doing an exercise treadmill test to exclude ischemia, on the one      hand (doubt), although also ensure that she does not have any      exercise-induced arrhythmias.  If this is normal I do not      anticipate any additional cardiac evaluation and she can continue      to follow up with Dr. Fabian Sharp and her psychologist.  Otherwise, no      new medications are anticipated at this time.  2. Will inform the patient of her test results.     Jonelle Sidle, MD  Electronically Signed    SGM/MedQ  DD: 07/15/2007  DT: 07/15/2007  Job #: [id number]   cc:   Neta Mends. Fabian Sharp, MD

## 2011-03-06 NOTE — Group Therapy Note (Signed)
NAMEKORIE, STREAT               ACCOUNT NO.:  [id number]   MEDICAL RECORD NO.:  [id number]          PATIENT TYPE:  WOC   LOCATION:  [facility]                   FACILITY:  [facility]   PHYSICIAN:  Argentina Donovan, MD        DATE OF BIRTH:  1986-03-25   DATE OF SERVICE:  02/10/2009                                  CLINIC NOTE   CHIEF COMPLAINT:  Pelvic pain and breast tenderness.   HISTORY OF PRESENT ILLNESS:  Ms. Wisser is a 25 year old gravida 1, para  0-0-1-0, who presents complaining of a several-month course of left-  sided lower pelvic pain.  Additionally, she relates a 54-month course of  breast fullness and tenderness.  She denies any significant vaginal  discharge.  She states that she has been in a monogenous relationship  with other female for an extended period time and states there is not  any possibility that  she could be pregnant.  She relates no breast  discharge or any palpable masses, as far she has noticed.  She was seen  in the emergency room at the beginning of this month and a CT of her  abdomen and pelvis revealed a 4 cm x 3 cm simple cyst in the left adnexa  with a tiny amount of free fluid.  No other abnormalities were found on  that study.  The evaluation during that ER visit included a negative  urinalysis, and a wet prep that showed clue cells, which apparently the  patient was not treated for.   PAST MEDICAL HISTORY/MEDICATIONS:  The past medical history was  reviewed.  The patient takes Lexapro and Depakote for bipolar disorder.   PAST SURGICAL HISTORY:  The patient had hernia repairs as a young child.   OBSTETRICAL HISTORY:  The patient relates she has had a miscarriage at  the age of 25 years old.   PAST GYN HISTORY:  She has not had an abnormal Pap smear; however, she  had a cervical lesion with a LEEP done in the operating room.  The  results of that revealed erosive cervicitis without any atypia.   Her medications are reviewed and reconciled in the  patient's chart.   SOCIAL HISTORY:  The patient denies tobacco or alcohol use.  She does  relate to moderate caffeine intake.   PHYSICAL EXAMINATION:  VITAL SIGNS:  Her vital signs are reviewed and  are normal.  The patient is 5 feet 5 inches tall and weighs 143 pounds.  GENERAL:  She is healthy-appearing, in no apparent distress.  BREASTS:  A breast exam was performed and revealed no dominant masses.  Fibrocystic breast tissue is noted bilaterally.  There is no nipple  discharge.  The was no axillary adenopathy.  ABDOMEN:  Her abdomen was soft, normoactive bowel sounds.  The patient  related that she had some left lower quadrant tenderness; however, this  is not very remarkable on my exam.  There is no rebound or guarding  noted.  No organomegaly noted.  GENITOURINARY:  External female genitalia is normal-appearing.  The  vaginal mucosa revealed normal rugae.  Cervix was nulliparous-appearing  without a lesion.  There was a moderate amount of a yellowish vaginal  discharge.  BIMANUAL EXAM:  Was performed, which the patient did not tolerate very  well and revealed no fullness in the left adnexa; however, the patient  relates tenderness in that area.  No fullness or tenderness at the right  adnexa.  The uterus was normal size and shape.  A wet prep was done  which revealed numerous clue cells.   ASSESSMENT/PLAN:  1. Pelvic pain, of unclear etiology:  Her pain may be related to an      ovarian cyst; however, given the patient's social and psychiatric      history, some of the pain could be of a supratentorial origin.  We      decided to do a trial of Sprintec, oral contraceptive pills.  This      would be effective if there was a contribution as stated by the      ovarian cyst.  Additionally, an ultrasound was scheduled for later      in May to re-evaluate the ovarian cyst.  2. Bacterial vaginosis:  Flagyl 500 mg b.i.d. for 7 days given.  The      patient relates that she has had  several of these infections.  She      states she does not really like yogurt, so I recommended her to buy      some over-the-counter her probiotic supplements.  3. Mastodynia, of unclear etiology:  Given that the patient's young      age, no masses noted on exam, no discharge, that her pain could be      related to a hormonal thing, related to her menstrual cycle or      perhaps as a supratentorial complex as well.     ______________________________  Odie Sera, D.O.    ______________________________  Argentina Donovan, MD    MC/MEDQ  D:  02/10/2009  T:  02/10/2009  Job:  [id number]
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
## 2011-03-09 NOTE — Assessment & Plan Note (Signed)
[facility]                            CARDIOLOGY OFFICE NOTE   [name], [name]                      MRN:          [id number]  DATE:01/01/2007                            DOB:          1986-02-20    PRIMARY CARE PHYSICIAN:  [name], M.D.   REASON FOR CONSULTATION:  History of syncope.   HISTORY OF PRESENT ILLNESS:  [name] is an 25 year old young woman  with no reported major medical conditions who states that she has a  history of anxiety disorder and has recently manifested symptoms  including chest pain, body numbness, hyperventilation, changes in colors  of her fingers, headaches and syncope. She is present today with her  stepmother. She states that her typical anxiety attacks include a  racing heart as well as feeling of trembling that may last anywhere  from 20 minutes to hours at time. Reportedly unrelated to this has been  other episodes which she states start off with a [name]/stabbing chest  pain that may last up to 15 minutes followed by a feeling of body  numbness and then general hyperventilation with purple fingers and  headache. With these episodes which have occurred over the last month,  she has had two episodes of syncope, one approximately three weeks ago  when she was standing at a desk at work and one while she was seated at  home that occurred a few days ago. She denies ever having any similar  problems to this in the past. She had an electrocardiogram obtained by  Dr. Fabian [name] back on February 20 which demonstrated sinus rhythm with a  RSR prime pattern in leads V1 and V2/incomplete right bundle branch  block but normal QT interval and no clear evidence of preexcitation. Her  repeat tracing today shows similar pattern, again with a normal QT  interval, no obvious preexcitation and RSR prime patterns in V1 and V2.  No clear morphology of Brugada syndrome is evident, and I did review  this tracing with Dr. Ladona [name].  As far as family history, [name] states  that she has a brother and sister who both have problems with heart  fluttering and hypertension. The patient's sister is 64, and her  brother is 77, and [name] states that they are both on medications  for this. She denies any family history of sudden cardiac death,  however. She has had no prior cardiac testing other than  electrocardiogram and is here for further assessment today.   ALLERGIES:  No known drug allergies.   PRESENT MEDICATIONS:  None.   SOCIAL HISTORY:  The patient is single, has no children. Has a previous  history of tobacco use but quit in September of 2006. Denies any illicit  substance use. Drinks alcohol beverages occasionally. She is not  exercising regularly at this point. She works as a Chief of Staff for a [employer]   FAMILY HISTORY:  Is as outlined above. The patient states that her  grandmother died in her 66s with COPD, type 2 diabetes mellitus and  hypertension. She also has an aunt with  heart problems and obesity, a  sister at age 44 and brother at age 57 with history of heart  fluttering and hypertension - both on medications.   REVIEW OF SYSTEMS:  As described in the history of present illness. She  has had seasonal allergies and fatigue.   Prior surgical history includes previous umbilical hernia repair in  1997.   PHYSICAL EXAMINATION:  Weight is 130 pounds, blood pressure 112/80,  heart rate is 67.  This is a normally nourished-appearing woman in no acute distress.  HEENT:  Conjunctivae and lids normal. Oropharynx clear.  NECK:  Supple without elevated jugular venous pressure, without bruits.  No thyromegaly is noted.  LUNGS:  Are clear without labored breathing.  CARDIAC EXAM:  Reveals a regular rate and rhythm. No loud murmur or S3  gallop. No pericardial rub.  ABDOMEN:  Soft. No obvious hepatomegaly. No bruits. Nontender.  EXTREMITIES:  No pitting edema. Distal pulses are  2+.  SKIN:  Warm and dry.  MUSCULOSKELETAL:  No kyphosis is noted.  NEUROPSYCHIATRIC:  The patient is oriented x3.   IMPRESSION AND RECOMMENDATIONS:  1. Symptom complex as outlined above including atypical chest pain and      syncope. There does not appear to be any specific trigger for this,      and she also reports a history of anxiety with racing heart and      feeling of tremulousness. Her resting electrocardiogram essentially      shows a RSR prime pattern in V1 and V2/incomplete right bundle      branch block but no clear evidence of preexcitation, a normal QT      interval and no clear morphology to indicate Brugada syndrome. I      discussed this with the patient and her stepmother today. We      reviewed a variety of options for further testing, and at this      point, have elected to proceed with a resting 2-D echocardiogram to      assess cardiac structure and function as well as an event monitor      to document for any potential dysrhythmias. I will then have her      followup over the next month, and we can review these results. She      is not relating any specific exertional symptoms, so we have not      pursued any stress testing at this point.  2. Further plans to follow.     Gabriela Sidle, MD  Electronically Signed    SGM/MedQ  DD: 01/01/2007  DT: 01/03/2007  Job #: [id number]   cc:   Gabriela Mends. Gabriela Sharp, MD

## 2011-03-09 NOTE — Discharge Summary (Signed)
[name], [name]               ACCOUNT NO.:  [id number]   MEDICAL RECORD NO.:  [id number]          PATIENT TYPE:  IPS   LOCATION:  0304                          [facility]ILITY[facility]   PHYSICIAN:  Anselm Jungling, MD  DATE OF BIRTH:  1986/05/15   DATE OF ADMISSION:  02/08/2007  DATE OF DISCHARGE:  02/11/2007                               DISCHARGE SUMMARY   INDENTIFYING DATA AND REASON FOR ADMISSION:  The patient is a 25-year-  old single female who had presented at the [facility] Emergency  Department, reporting that she had been having suicidal thoughts.  She  reported that she had had an upsetting conversation with her father  during the previous week and had attempted suicide multiple times.  She  had a history of anxiety disorder.  Please refer to the admission note  for further details pertaining to the symptoms, circumstances and  history that led to her hospitalization.  She was given an initial Axis  I diagnosis of major depressive disorder and post-traumatic stress  disorder.   MEDICAL AND LABORATORY:  The patient was medically cleared in the  emergency department, and then medically and physically reassessed by  the psychiatric physician's assistant upon arrival to the inpatient  psychiatric unit.  She came to [place] with a history of syncope, and had  recently finished evaluation using an event monitor, and had a followup  appointment scheduled for April 28.   There were no significant medical issues during this brief inpatient  psychiatric stay.   HOSPITAL COURSE:  The patient was admitted to the adult inpatient  psychiatric service.  She presented as a well-nourished, well-developed  young woman who was depressed, but pleasant and cooperative.  She  participated in various therapeutic groups and activities.  During the  initial portion of her inpatient stay, she was seen and cared for by Dr.  Milford Cage.  Dr. Katrinka Blazing began the trial of Lamictal for presumed  underlying  bipolar depression.   By the 4th hospital day, the patient appeared appropriate for discharge,  having been absent suicidal ideation throughout her stay, and appearing  to tolerate Lamictal well.  She agreed to the following aftercare plan.   AFTERCARE:  The patient was to follow up with Dr. Nolen Mu for  medication management with an appointment on Feb 26, 2007, and Ollen Gross, therapist, with an appointment on February 17, 2007.   DISCHARGE MEDICATIONS:  Lamictal 25 mg daily.   DISCHARGE DIAGNOSES:  AXIS I:  Bipolar disorder, type 2, currently  depressed.  AXIS II:  Deferred.  AXIS III:  History of syncope, unclear etiology.  AXIS IV:  Stressors severe.  AXIS V:  Global assessment of functioning on discharge 65.     Anselm Jungling, MD  Electronically Signed    SPB/MEDQ  D:  02/17/2007  T:  02/17/2007  Job:  [id number]

## 2011-03-09 NOTE — Assessment & Plan Note (Signed)
[facility]                                 ON-CALL NOTE   [name], [name]                        MRN:          [id number]  DATE:01/08/2007                            DOB:          11-Apr-1986    PRIMARY CARE PHYSICIAN:  Dr. Neta Mends. Panosh.   PRIMARY CARDIOLOGIST:  Dr. Simona Huh.   I received a page from the answering service concerning [name],  stating that she was complaining of shortness of breath and dizziness.  A phone number was left for me to return her call at [phone] and  speak with someone by the name of [name].  The phone number that  I called, they did not answer or it was busy.  The answering service  also called to confirm that the patient did have a followup phone call  from myself and they were unable to get through on that number as well.  I have made several attempts to return her call but have not been able  to get through, either because the patient did not answer the phone, or  the phone line was busy.   The patient was last seen by Dr. Simona Huh on January 01, 2007 with  similar episodes of anxiety, two episodes of syncope, and some  dizziness.  The patient had scheduled an echocardiogram through Dr.  Diona Browner but is due to follow up with him in one month.   I have not made any further attempts to return this patient's call.  If  she does page again I will be happy to follow up with a phone call in  return.      Bettey Mare. Lyman Bishop, NP  Electronically Signed      Noralyn Pick. Eden Emms, MD, [facility]  Electronically Signed   KML/MedQ  DD: 01/08/2007  DT: 01/09/2007  Job #: [id number]   cc:   Neta Mends. Fabian Sharp, MD
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
## 2011-03-12 IMAGING — CT CT PELVIS W/ CM
1 series · 1 of 2 positions shown · IV contrast (agent unspecified)
Comparison: None

CT ABDOMEN

CLINICAL DATA: Left-sided abdominal and pelvic pain.

CT ABDOMEN AND PELVIS WITH CONTRAST
TECHNIQUE: Multidetector CT imaging of the abdomen and pelvis was
performed using the standard protocol following bolus
administration of intravenous contrast.
Contrast: 100 ml Emnipaque-2TT and oral contrast

[Series 1: topogram 0.6 t20s · coronal · 0.6mm · 1.00mm/px · 1 of 2 slices shown]
[im 2/2]
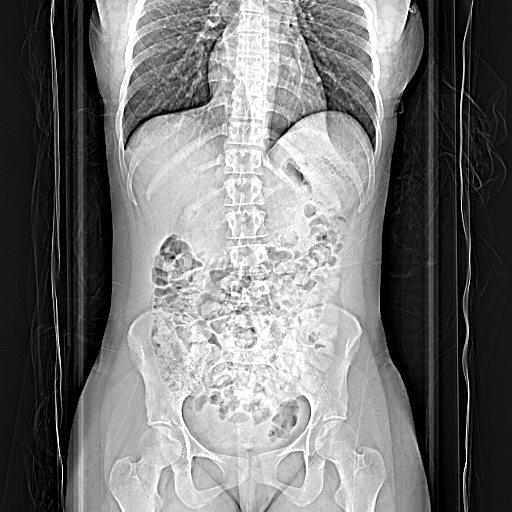

[1 of 2 positions shown; findings below may reference images not displayed]

FINDINGS: The abdominal parenchymal organs are normal in
appearance.  Gallbladder is unremarkable.  There is no evidence of
hydronephrosis.

There is no evidence of soft tissue mass or lymphadenopathy.  No
inflammatory process or abnormal fluid collections identified.
IMPRESSION: Negative abdomen CT.

CT PELVIS
FINDINGS: A simple appearing cyst is seen in the left adnexa which
measures 4.0 x 3.0 cm.  This most likely represents a physiologic
ovarian cyst.  A tiny amount of free fluid is noted in the right
pelvic cul-de-sac, which is likely physiologic.

No other pelvic masses are identified.  There is no evidence of
inflammatory process or abscess.  There is no evidence of dilated
bowel loops.
IMPRESSION: 4 cm left ovarian cyst and tiny amount of free fluid, which are
likely physiologic.

## 2011-04-14 ENCOUNTER — Inpatient Hospital Stay (HOSPITAL_COMMUNITY)
Admission: AD | Admit: 2011-04-14 | Discharge: 2011-04-14 | Disposition: A | Discharge status: Home / Self Care | Payer: Self-pay | Source: Ambulatory Visit | Attending: Obstetrics & Gynecology | Admitting: Obstetrics & Gynecology

## 2011-04-14 DIAGNOSIS — B9689 Other specified bacterial agents as the cause of diseases classified elsewhere: Secondary | ICD-10-CM | POA: Insufficient documentation

## 2011-04-14 DIAGNOSIS — N760 Acute vaginitis: Secondary | ICD-10-CM

## 2011-04-14 DIAGNOSIS — A499 Bacterial infection, unspecified: Secondary | ICD-10-CM | POA: Insufficient documentation

## 2011-04-14 DIAGNOSIS — J45909 Unspecified asthma, uncomplicated: Secondary | ICD-10-CM | POA: Insufficient documentation

## 2011-04-14 LAB — URINALYSIS, ROUTINE W REFLEX MICROSCOPIC
Bilirubin Urine: NEGATIVE
Hgb urine dipstick: NEGATIVE
Ketones, ur: NEGATIVE mg/dL
Nitrite: NEGATIVE
pH: 6 (ref 5.0–8.0)

## 2011-04-14 LAB — WET PREP, GENITAL

## 2011-04-14 LAB — POCT PREGNANCY, URINE: Preg Test, Ur: NEGATIVE

## 2011-04-17 LAB — GC/CHLAMYDIA PROBE AMP, GENITAL
Chlamydia, DNA Probe: NEGATIVE
GC Probe Amp, Genital: NEGATIVE

## 2011-06-22 ENCOUNTER — Inpatient Hospital Stay (HOSPITAL_COMMUNITY): Payer: Self-pay

## 2011-06-22 ENCOUNTER — Inpatient Hospital Stay (HOSPITAL_COMMUNITY)
Admission: AD | Admit: 2011-06-22 | Discharge: 2011-06-22 | Disposition: A | Discharge status: Home / Self Care | Payer: Self-pay | Source: Ambulatory Visit | Attending: Obstetrics | Admitting: Obstetrics

## 2011-06-22 ENCOUNTER — Encounter (HOSPITAL_COMMUNITY): Payer: Self-pay | Admitting: *Deleted

## 2011-06-22 DIAGNOSIS — N898 Other specified noninflammatory disorders of vagina: Secondary | ICD-10-CM | POA: Insufficient documentation

## 2011-06-22 DIAGNOSIS — N939 Abnormal uterine and vaginal bleeding, unspecified: Secondary | ICD-10-CM

## 2011-06-22 DIAGNOSIS — F411 Generalized anxiety disorder: Secondary | ICD-10-CM | POA: Insufficient documentation

## 2011-06-22 DIAGNOSIS — B977 Papillomavirus as the cause of diseases classified elsewhere: Secondary | ICD-10-CM | POA: Insufficient documentation

## 2011-06-22 DIAGNOSIS — F17200 Nicotine dependence, unspecified, uncomplicated: Secondary | ICD-10-CM | POA: Insufficient documentation

## 2011-06-22 DIAGNOSIS — N949 Unspecified condition associated with female genital organs and menstrual cycle: Secondary | ICD-10-CM | POA: Insufficient documentation

## 2011-06-22 HISTORY — DX: Anxiety disorder, unspecified: F41.9

## 2011-06-22 HISTORY — DX: Papillomavirus as the cause of diseases classified elsewhere: B97.7

## 2011-06-22 LAB — URINALYSIS, ROUTINE W REFLEX MICROSCOPIC
Ketones, ur: NEGATIVE mg/dL
Leukocytes, UA: NEGATIVE
Nitrite: NEGATIVE
Protein, ur: NEGATIVE mg/dL
Urobilinogen, UA: 0.2 mg/dL (ref 0.0–1.0)
pH: 6 (ref 5.0–8.0)

## 2011-06-22 LAB — CBC
MCH: 30.4 pg (ref 26.0–34.0)
MCHC: 33.4 g/dL (ref 30.0–36.0)
Platelets: 226 10*3/uL (ref 150–400)
RDW: 14.2 % (ref 11.5–15.5)

## 2011-06-22 LAB — WET PREP, GENITAL
Trich, Wet Prep: NONE SEEN
Yeast Wet Prep HPF POC: NONE SEEN

## 2011-06-22 LAB — URINE MICROSCOPIC-ADD ON

## 2011-06-22 MED ORDER — MEDROXYPROGESTERONE ACETATE 10 MG PO TABS: 10.0000 mg | ORAL_TABLET | Freq: Every day | ORAL | Status: DC

## 2011-06-22 MED ORDER — KETOROLAC TROMETHAMINE 60 MG/2ML IM SOLN
60.0000 mg | Freq: Once | INTRAMUSCULAR | Status: AC
  Administered 2011-06-22: 60 mg via INTRAMUSCULAR
  Filled 2011-06-22: qty 2

## 2011-06-22 MED ORDER — NAPROXEN SODIUM 550 MG PO TABS: 550.0000 mg | ORAL_TABLET | Freq: Two times a day (BID) | ORAL | Status: DC

## 2011-06-22 NOTE — ED Provider Notes (Signed)
History   Pt presents today c/o irregular vag bleeding and left lower quad pain. She states she had a NL menses at the beginning of August and then her bleeding stopped for about 1wk. Since that time, she has been bleeding heavily on a daily basis and has had increasing pain in her LLQ. She denies fever, vag irritation, or any other sx at this time.  Chief Complaint  Patient presents with  . Vaginal Bleeding   HPI  OB History    Grav Para Term Preterm Abortions TAB SAB Ect Mult Living   1    1  1          Past Medical History  Diagnosis Date  . HPV in female   . Anxiety     Past Surgical History  Procedure Date  . Hernia repair   . Ovarian cyst removal   . Wisdom tooth extraction     No family history on file.  History  Substance Use Topics  . Smoking status: Current Everyday Smoker -- 0.2 packs/day  . Smokeless tobacco: Not on file  . Alcohol Use: No    Allergies:  Allergies  Allergen Reactions  . Morphine And Related Other (See Comments)    Patient says it burns her skin and causes blisters  . Lamictal (Lamotrigine) Rash  . Latex Rash    Prescriptions prior to admission  Medication Sig Dispense Refill  . ibuprofen (ADVIL,MOTRIN) 200 MG tablet Take 200 mg by mouth every 6 (six) hours as needed. For cramping         Review of Systems  Constitutional: Negative for fever.  Gastrointestinal: Positive for abdominal pain. Negative for nausea, vomiting, diarrhea and constipation.  Genitourinary: Negative for dysuria, urgency, frequency and hematuria.  Neurological: Negative for dizziness and headaches.  Psychiatric/Behavioral: Negative for depression and suicidal ideas.   Physical Exam   Blood pressure 114/76, pulse 67, temperature 98.3 F (36.8 C), temperature source Oral, resp. rate 18, height 5\' 4"  (1.626 m), weight 133 lb 12.8 oz (60.691 kg), last menstrual period 05/27/2011.  Physical Exam  Constitutional: She is oriented to person, place, and time. She  appears well-developed and well-nourished. No distress.  HENT:  Head: Normocephalic and atraumatic.  Eyes: EOM are normal. Pupils are equal, round, and reactive to light.  GI: Soft. She exhibits no distension and no mass. There is tenderness. There is no rebound and no guarding.  Genitourinary: There is bleeding around the vagina. No vaginal discharge found.       Uterus is NL size and shape. There is tenderness in the left adnexa. No obvious masses noted.  Neurological: She is alert and oriented to person, place, and time.  Skin: Skin is warm and dry. She is not diaphoretic.  Psychiatric: She has a normal mood and affect. Her behavior is normal. Judgment and thought content normal.    MAU Course  Procedures  Wet prep and GC/Chlamydia cultures done.  Results for orders placed during the hospital encounter of 06/22/11 (from the past 24 hour(s))  URINALYSIS, ROUTINE W REFLEX MICROSCOPIC     Status: Abnormal   Collection Time   06/22/11  1:40 PM      Component Value Range   Color, Urine YELLOW  YELLOW    Appearance CLEAR  CLEAR    Specific Gravity, Urine 1.010  1.005 - 1.030    pH 6.0  5.0 - 8.0    Glucose, UA NEGATIVE  NEGATIVE (mg/dL)   Hgb urine dipstick MODERATE (*)  NEGATIVE    Bilirubin Urine NEGATIVE  NEGATIVE    Ketones, ur NEGATIVE  NEGATIVE (mg/dL)   Protein, ur NEGATIVE  NEGATIVE (mg/dL)   Urobilinogen, UA 0.2  0.0 - 1.0 (mg/dL)   Nitrite NEGATIVE  NEGATIVE    Leukocytes, UA NEGATIVE  NEGATIVE   URINE MICROSCOPIC-ADD ON     Status: Normal   Collection Time   06/22/11  1:40 PM      Component Value Range   RBC / HPF 0-2  <3 (RBC/hpf)  CBC     Status: Normal   Collection Time   06/22/11  2:13 PM      Component Value Range   WBC 4.7  4.0 - 10.5 (K/uL)   RBC 4.28  3.87 - 5.11 (MIL/uL)   Hemoglobin 13.0  12.0 - 15.0 (g/dL)   HCT 40.9  81.1 - 91.4 (%)   MCV 90.9  78.0 - 100.0 (fL)   MCH 30.4  26.0 - 34.0 (pg)   MCHC 33.4  30.0 - 36.0 (g/dL)   RDW 78.2  95.6 - 21.3 (%)     Platelets 226  150 - 400 (K/uL)  POCT PREGNANCY, URINE     Status: Normal   Collection Time   06/22/11  2:53 PM      Component Value Range   Preg Test, Ur NEGATIVE    WET PREP, GENITAL     Status: Abnormal   Collection Time   06/22/11  2:59 PM      Component Value Range   Yeast, Wet Prep NONE SEEN  NONE SEEN    Trich, Wet Prep NONE SEEN  NONE SEEN    Clue Cells, Wet Prep MODERATE (*) NONE SEEN    WBC, Wet Prep HPF POC FEW (*) NONE SEEN    US Transvaginal Non-ob  06/22/2011  *RADIOLOGY REPORT*  Clinical Data: Dysfunctional uterine bleeding.  TRANSABDOMINAL AND TRANSVAGINAL ULTRASOUND OF PELVIS Technique:  Both transabdominal and transvaginal ultrasound examinations of the pelvis were performed. Transabdominal technique was performed for global imaging of the pelvis including uterus, ovaries, adnexal regions, and pelvic cul-de-sac.  Comparison: None   It was necessary to proceed with endovaginal exam following the transabdominal exam to visualize the ovaries and endometrium.  Findings:  Uterus: Retroverted in position.  It measures 7.7 x 4.6 x 6.6 cm. No myometrial abnormalities are identified.  Endometrium: Normal in thickness measuring a maximum of 8.7 mm. A small amount of endometrial fluid is present.  Right ovary:  Measures 3.7 x 2.3 x 2.4 cm.  No cysts or masses.  Left ovary: Measures 3.8 x 1.8 x 3.5 cm.  No cysts or masses.  Other findings: Moderate echogenic fluid in the cul-de-sac possibly due to a ruptured cyst.  IMPRESSION:  1.  Unremarkable sonographic appearance of the uterus.  A small amount of fluid is noted in the endometrial canal. 2.  Normal ovaries. 3.  Moderate echogenic fluid in the cul-de-sac possibly due to a ruptured cyst.  Original Report Authenticated By: P. Loralie Champagne, M.D.   US Pelvis Complete  06/22/2011  *RADIOLOGY REPORT*  Clinical Data: Dysfunctional uterine bleeding.  TRANSABDOMINAL AND TRANSVAGINAL ULTRASOUND OF PELVIS Technique:  Both transabdominal and  transvaginal ultrasound examinations of the pelvis were performed. Transabdominal technique was performed for global imaging of the pelvis including uterus, ovaries, adnexal regions, and pelvic cul-de-sac.  Comparison: None   It was necessary to proceed with endovaginal exam following the transabdominal exam to visualize the ovaries and endometrium.  Findings:  Uterus: Retroverted in position.  It measures 7.7 x 4.6 x 6.6 cm. No myometrial abnormalities are identified.  Endometrium: Normal in thickness measuring a maximum of 8.7 mm. A small amount of endometrial fluid is present.  Right ovary:  Measures 3.7 x 2.3 x 2.4 cm.  No cysts or masses.  Left ovary: Measures 3.8 x 1.8 x 3.5 cm.  No cysts or masses.  Other findings: Moderate echogenic fluid in the cul-de-sac possibly due to a ruptured cyst.  IMPRESSION:  1.  Unremarkable sonographic appearance of the uterus.  A small amount of fluid is noted in the endometrial canal. 2.  Normal ovaries. 3.  Moderate echogenic fluid in the cul-de-sac possibly due to a ruptured cyst.  Original Report Authenticated By: P. Loralie Champagne, M.D.     Assessment and Plan  Pelvic pain and vag bleeding: discussed with pt at length. Possibly secondary to ruptured ovarian cyst. Will give Rx for provera and anaprox ds. Discussed diet, activity, risks, and precautions. She will f/u with her PCP.  Clinton Gallant. Rice III, DrHSc, MPAS, PA-C  06/22/2011, 3:01 PM   Henrietta Hoover, PA 06/22/11 1747

## 2011-06-22 NOTE — Progress Notes (Signed)
Had period 08/05-08/09, as expected.  Was off for a wk, and started back bleeding.  The bleeding has continued.  Has gotten heavier, wearing tampon and pad, changing at least once an hour.

## 2011-07-16 LAB — PREGNANCY, URINE: Preg Test, Ur: NEGATIVE

## 2011-07-16 LAB — URINALYSIS, ROUTINE W REFLEX MICROSCOPIC
Bilirubin Urine: NEGATIVE
Nitrite: NEGATIVE
Specific Gravity, Urine: 1.02
Urobilinogen, UA: 0.2
pH: 7.5

## 2011-07-19 LAB — CBC
HCT: 35.8 — ABNORMAL LOW
Platelets: 192
RDW: 13.7
WBC: 3.9 — ABNORMAL LOW

## 2011-07-19 LAB — URINALYSIS, ROUTINE W REFLEX MICROSCOPIC
Leukocytes, UA: NEGATIVE
Nitrite: NEGATIVE
Protein, ur: NEGATIVE
Specific Gravity, Urine: 1.01
Urobilinogen, UA: 0.2

## 2011-07-19 LAB — WET PREP, GENITAL: Trich, Wet Prep: NONE SEEN

## 2011-07-19 LAB — URINE MICROSCOPIC-ADD ON

## 2011-07-19 LAB — GC/CHLAMYDIA PROBE AMP, GENITAL: Chlamydia, DNA Probe: NEGATIVE

## 2011-07-19 LAB — POCT PREGNANCY, URINE: Preg Test, Ur: NEGATIVE

## 2011-07-24 LAB — DIFFERENTIAL
Basophils Absolute: 0
Basophils Relative: 1
Eosinophils Absolute: 0
Eosinophils Relative: 1
Lymphocytes Relative: 34
Lymphs Abs: 1.6
Monocytes Absolute: 0.4
Monocytes Relative: 8
Neutro Abs: 2.7
Neutrophils Relative %: 57

## 2011-07-24 LAB — CBC
HCT: 39.1
Hemoglobin: 13.2
MCHC: 33.7
MCV: 88.2
Platelets: 207
RBC: 4.43
RDW: 12.9
WBC: 4.7

## 2011-07-24 LAB — PREGNANCY, URINE: Preg Test, Ur: NEGATIVE

## 2011-07-24 LAB — GC/CHLAMYDIA PROBE AMP, GENITAL
Chlamydia, DNA Probe: NEGATIVE
GC Probe Amp, Genital: NEGATIVE

## 2011-07-24 LAB — WET PREP, GENITAL
Trich, Wet Prep: NONE SEEN
Yeast Wet Prep HPF POC: NONE SEEN

## 2011-08-01 LAB — WET PREP, GENITAL
Trich, Wet Prep: NONE SEEN
Yeast Wet Prep HPF POC: NONE SEEN

## 2011-08-01 LAB — URINALYSIS, ROUTINE W REFLEX MICROSCOPIC
Bilirubin Urine: NEGATIVE
Glucose, UA: NEGATIVE
Ketones, ur: NEGATIVE
Protein, ur: NEGATIVE
pH: 6

## 2011-08-02 LAB — POCT URINALYSIS DIP (DEVICE)
Nitrite: NEGATIVE
Protein, ur: NEGATIVE
Urobilinogen, UA: 1
pH: 8.5 — ABNORMAL HIGH

## 2011-08-02 LAB — POCT PREGNANCY, URINE
Operator id: 235561
Preg Test, Ur: NEGATIVE

## 2011-08-06 LAB — POCT URINALYSIS DIP (DEVICE)
Ketones, ur: NEGATIVE
Operator id: 239701
Specific Gravity, Urine: 1.02
pH: 7

## 2011-08-06 LAB — GC/CHLAMYDIA PROBE AMP, GENITAL: Chlamydia, DNA Probe: NEGATIVE

## 2011-08-06 LAB — WET PREP, GENITAL: Trich, Wet Prep: NONE SEEN

## 2011-08-06 LAB — POCT PREGNANCY, URINE: Preg Test, Ur: NEGATIVE

## 2012-02-12 ENCOUNTER — Encounter (HOSPITAL_COMMUNITY): Payer: Self-pay | Admitting: *Deleted

## 2012-02-12 ENCOUNTER — Emergency Department (HOSPITAL_COMMUNITY)
Admission: EM | Admit: 2012-02-12 | Discharge: 2012-02-13 | Disposition: A | Discharge status: Home / Self Care | Payer: Self-pay | Attending: Emergency Medicine | Admitting: Emergency Medicine

## 2012-02-12 DIAGNOSIS — R1032 Left lower quadrant pain: Secondary | ICD-10-CM | POA: Insufficient documentation

## 2012-02-12 DIAGNOSIS — A499 Bacterial infection, unspecified: Secondary | ICD-10-CM | POA: Insufficient documentation

## 2012-02-12 DIAGNOSIS — M79609 Pain in unspecified limb: Secondary | ICD-10-CM | POA: Insufficient documentation

## 2012-02-12 DIAGNOSIS — B9689 Other specified bacterial agents as the cause of diseases classified elsewhere: Secondary | ICD-10-CM | POA: Insufficient documentation

## 2012-02-12 DIAGNOSIS — N760 Acute vaginitis: Secondary | ICD-10-CM | POA: Insufficient documentation

## 2012-02-12 DIAGNOSIS — F17200 Nicotine dependence, unspecified, uncomplicated: Secondary | ICD-10-CM | POA: Insufficient documentation

## 2012-02-12 DIAGNOSIS — M549 Dorsalgia, unspecified: Secondary | ICD-10-CM | POA: Insufficient documentation

## 2012-02-12 DIAGNOSIS — N83209 Unspecified ovarian cyst, unspecified side: Secondary | ICD-10-CM | POA: Insufficient documentation

## 2012-02-12 LAB — BASIC METABOLIC PANEL
Calcium: 9.5 mg/dL (ref 8.4–10.5)
GFR calc non Af Amer: >90 mL/min (ref >90)
Glucose, Bld: 87 mg/dL (ref 70–99)
Potassium: 3.5 mEq/L (ref 3.5–5.1)
Sodium: 137 mEq/L (ref 135–145)

## 2012-02-12 LAB — URINALYSIS, ROUTINE W REFLEX MICROSCOPIC
Bilirubin Urine: NEGATIVE
Ketones, ur: NEGATIVE mg/dL
Leukocytes, UA: NEGATIVE
Nitrite: NEGATIVE
Specific Gravity, Urine: 1.019 (ref 1.005–1.030)
Urobilinogen, UA: 0.2 mg/dL (ref 0.0–1.0)
pH: 6.5 (ref 5.0–8.0)

## 2012-02-12 LAB — CBC
Hemoglobin: 12.7 g/dL (ref 12.0–15.0)
MCH: 30.8 pg (ref 26.0–34.0)
MCHC: 34.5 g/dL (ref 30.0–36.0)
Platelets: 233 10*3/uL (ref 150–400)
RBC: 4.12 MIL/uL (ref 3.87–5.11)

## 2012-02-12 LAB — POCT PREGNANCY, URINE: Preg Test, Ur: NEGATIVE

## 2012-02-12 NOTE — ED Notes (Signed)
PT reports a 5 day HX of Lt sided ABD and flank pain. The pain now radiates down Lt leg.

## 2012-02-13 ENCOUNTER — Emergency Department (HOSPITAL_COMMUNITY): Payer: Self-pay

## 2012-02-13 ENCOUNTER — Telehealth: Payer: Self-pay | Admitting: *Deleted

## 2012-02-13 MED ORDER — METRONIDAZOLE 500 MG PO TABS: 500.0000 mg | ORAL_TABLET | Freq: Two times a day (BID) | ORAL | Status: AC

## 2012-02-13 MED ORDER — HYDROCODONE-ACETAMINOPHEN 5-325 MG PO TABS
1.0000 | ORAL_TABLET | Freq: Once | ORAL | Status: AC
  Administered 2012-02-13: 1 via ORAL
  Filled 2012-02-13: qty 1

## 2012-02-13 MED ORDER — HYDROCODONE-ACETAMINOPHEN 5-325 MG PO TABS: 1.0000 | ORAL_TABLET | ORAL | Status: AC | PRN

## 2012-02-13 NOTE — ED Provider Notes (Signed)
History     CSN: [id number]  Arrival date & time 02/12/12  2228   First MD Initiated Contact with Patient 02/12/12 2337      Chief Complaint  Patient presents with  . Abdominal Pain  . Leg Pain    LT    HPI  History provided by the patient. Patient is a 26 year old female with past history of anxiety, ovarian cysts, fibroids of the uterus presents with complaints of increasing left lower quadrant abdominal pain for the past 5 days. Pain is increased gradually. Pain radiates some to the back and also toe left lower extremity area. Patient states she is unsure if the pains are related to her "female parts". It states that she has had a history of fibroid removal and cyst removal in the past. Pain still somewhat similar. Patient had last normal menstrual period on the first of the month and expects especially. To begin the first of next month. She does also go to OB/GYN every 6 months to followup for abnormal Pap smear. Her next appointment with Dr. Tamela Oddi is on may eighth. Patient denies having any vaginal bleeding or vaginal discharge. She denies any history of kidney stones. She denies any dysuria, urinary frequency or hematuria. Patient denies any fever, chills, sweats or nausea vomiting. Patient denies any diarrhea constipation.    Past Medical History  Diagnosis Date  . HPV in female   . Anxiety     Past Surgical History  Procedure Date  . Hernia repair   . Ovarian cyst removal   . Wisdom tooth extraction     No family history on file.  History  Substance Use Topics  . Smoking status: Current Everyday Smoker -- 0.2 packs/day  . Smokeless tobacco: Not on file  . Alcohol Use: No    OB History    Grav Para Term Preterm Abortions TAB SAB Ect Mult Living   1    1  1          Review of Systems  Constitutional: Negative for fever, chills and appetite change.  Respiratory: Negative for cough and shortness of breath.   Cardiovascular: Negative for chest pain.    Gastrointestinal: Positive for abdominal pain. Negative for nausea, vomiting, diarrhea, constipation and blood in stool.  Genitourinary: Positive for pelvic pain. Negative for dysuria, frequency, hematuria, flank pain, vaginal bleeding and vaginal discharge.  Musculoskeletal: Positive for back pain.    Allergies  Morphine and related; Lamictal; and Latex  Home Medications   Current Outpatient Rx  Name Route Sig Dispense Refill  . IBUPROFEN 200 MG PO TABS Oral Take 200 mg by mouth every 6 (six) hours as needed. For cramping       BP 111/69  Pulse 86  Temp(Src) 98.3 F (36.8 C) (Oral)  Resp 18  SpO2 100%  LMP 01/21/2012  Physical Exam  Nursing note and vitals reviewed. Constitutional: She is oriented to person, place, and time. She appears well-developed and well-nourished. No distress.  HENT:  Head: Normocephalic.  Cardiovascular: Normal rate and regular rhythm.   Pulmonary/Chest: Effort normal and breath sounds normal.  Abdominal: Soft.  Neurological: She is alert and oriented to person, place, and time.  Skin: Skin is warm and dry. No rash noted.  Psychiatric: She has a normal mood and affect. Her behavior is normal.    ED Course  Procedures   Results for orders placed during the hospital encounter of 02/12/12  URINALYSIS, ROUTINE W REFLEX MICROSCOPIC  Component Value Range   Color, Urine YELLOW  YELLOW    APPearance CLEAR  CLEAR    Specific Gravity, Urine 1.019  1.005 - 1.030    pH 6.5  5.0 - 8.0    Glucose, UA NEGATIVE  NEGATIVE (mg/dL)   Hgb urine dipstick NEGATIVE  NEGATIVE    Bilirubin Urine NEGATIVE  NEGATIVE    Ketones, ur NEGATIVE  NEGATIVE (mg/dL)   Protein, ur NEGATIVE  NEGATIVE (mg/dL)   Urobilinogen, UA 0.2  0.0 - 1.0 (mg/dL)   Nitrite NEGATIVE  NEGATIVE    Leukocytes, UA NEGATIVE  NEGATIVE   CBC      Component Value Range   WBC 8.8  4.0 - 10.5 (K/uL)   RBC 4.12  3.87 - 5.11 (MIL/uL)   Hemoglobin 12.7  12.0 - 15.0 (g/dL)   HCT 16.1  09.6 -  04.5 (%)   MCV 89.3  78.0 - 100.0 (fL)   MCH 30.8  26.0 - 34.0 (pg)   MCHC 34.5  30.0 - 36.0 (g/dL)   RDW 40.9  81.1 - 91.4 (%)   Platelets 233  150 - 400 (K/uL)  BASIC METABOLIC PANEL      Component Value Range   Sodium 137  135 - 145 (mEq/L)   Potassium 3.5  3.5 - 5.1 (mEq/L)   Chloride 104  96 - 112 (mEq/L)   CO2 22  19 - 32 (mEq/L)   Glucose, Bld 87  70 - 99 (mg/dL)   BUN 8  6 - 23 (mg/dL)   Creatinine, Ser 7.82  0.50 - 1.10 (mg/dL)   Calcium 9.5  8.4 - 95.6 (mg/dL)   GFR calc non Af Amer >90  >90 (mL/min)   GFR calc Af Amer >90  >90 (mL/min)  POCT PREGNANCY, URINE      Component Value Range   Preg Test, Ur NEGATIVE  NEGATIVE       US Transvaginal Non-ob  02/13/2012  *RADIOLOGY REPORT*  Clinical Data:  Left ovarian pain for 5 days.  History of cysts and fibroids.  TRANSABDOMINAL AND TRANSVAGINAL ULTRASOUND OF PELVIS DOPPLER ULTRASOUND OF OVARIES  Technique:  Both transabdominal and transvaginal ultrasound examinations of the pelvis were performed. Transabdominal technique was performed for global imaging of the pelvis including uterus, ovaries, adnexal regions, and pelvic cul-de-sac.  It was necessary to proceed with endovaginal exam following the transabdominal exam to visualize the uterus and endometrium.  Color and duplex Doppler ultrasound was utilized to evaluate blood flow to the ovaries.  Comparison:  06/22/2011  Findings:  Uterus:  The uterus is retroflexed.  Uterus measures 7.9 x 4.7 x 5.9 cm.  The myometrium appears mostly homogeneous.  No focal masses demonstrated.  Endometrium:  Normal endometrial stripe thickness measured at 9.7 mm.  Homogeneous echogenic appearance without fluid.  Right ovary: Right ovary measures 3.6 x 2.2 x 1.8 cm.  Normal follicular changes.  No abnormal adnexal masses.  Flow is demonstrated in the right ovary on color flow Doppler imaging.  Left ovary:   The left ovary measures 5.7 x 3.2 by 2.9 cm.  There is a dominant complex cystic structure  measuring about 3.9 x 3.8 x 3.2 cm diameter.  There is a predominant anechoic circumscribed appearance with loculated adjacent solid component.  The solid component may represent adjacent hemorrhagic cyst versus hemorrhagic component within the larger cyst. Color flow Doppler imaging demonstrates flow within the left ovarian tissue and around the cyst.  No significant flow within the cyst or within  the solid/hemorrhagic component.  Pulsed Doppler evaluation demonstrates normal low-resistance arterial and venous waveforms in both ovaries.  Small amount free fluid in the cul-de-sac and left adnexa.  IMPRESSION: Normal ultrasound appearance of the uterus, endometrium, and right ovary.  Complex cystic lesion with intrinsic or adjacent echogenic component probably representing hemorrhagic cyst.  Small amount of free fluid in the pelvis.  No sonographic evidence for ovarian torsion.  Original Report Authenticated By: Marlon Pel, M.D.   US Pelvis Complete  02/13/2012  *RADIOLOGY REPORT*  Clinical Data:  Left ovarian pain for 5 days.  History of cysts and fibroids.  TRANSABDOMINAL AND TRANSVAGINAL ULTRASOUND OF PELVIS DOPPLER ULTRASOUND OF OVARIES  Technique:  Both transabdominal and transvaginal ultrasound examinations of the pelvis were performed. Transabdominal technique was performed for global imaging of the pelvis including uterus, ovaries, adnexal regions, and pelvic cul-de-sac.  It was necessary to proceed with endovaginal exam following the transabdominal exam to visualize the uterus and endometrium.  Color and duplex Doppler ultrasound was utilized to evaluate blood flow to the ovaries.  Comparison:  06/22/2011  Findings:  Uterus:  The uterus is retroflexed.  Uterus measures 7.9 x 4.7 x 5.9 cm.  The myometrium appears mostly homogeneous.  No focal masses demonstrated.  Endometrium:  Normal endometrial stripe thickness measured at 9.7 mm.  Homogeneous echogenic appearance without fluid.  Right ovary:  Right ovary measures 3.6 x 2.2 x 1.8 cm.  Normal follicular changes.  No abnormal adnexal masses.  Flow is demonstrated in the right ovary on color flow Doppler imaging.  Left ovary:   The left ovary measures 5.7 x 3.2 by 2.9 cm.  There is a dominant complex cystic structure measuring about 3.9 x 3.8 x 3.2 cm diameter.  There is a predominant anechoic circumscribed appearance with loculated adjacent solid component.  The solid component may represent adjacent hemorrhagic cyst versus hemorrhagic component within the larger cyst. Color flow Doppler imaging demonstrates flow within the left ovarian tissue and around the cyst.  No significant flow within the cyst or within the solid/hemorrhagic component.  Pulsed Doppler evaluation demonstrates normal low-resistance arterial and venous waveforms in both ovaries.  Small amount free fluid in the cul-de-sac and left adnexa.  IMPRESSION: Normal ultrasound appearance of the uterus, endometrium, and right ovary.  Complex cystic lesion with intrinsic or adjacent echogenic component probably representing hemorrhagic cyst.  Small amount of free fluid in the pelvis.  No sonographic evidence for ovarian torsion.  Original Report Authenticated By: Marlon Pel, M.D.   [place] Art/ven Flow Abd Pelv Doppler  02/13/2012  *RADIOLOGY REPORT*  Clinical Data:  Left ovarian pain for 5 days.  History of cysts and fibroids.  TRANSABDOMINAL AND TRANSVAGINAL ULTRASOUND OF PELVIS DOPPLER ULTRASOUND OF OVARIES  Technique:  Both transabdominal and transvaginal ultrasound examinations of the pelvis were performed. Transabdominal technique was performed for global imaging of the pelvis including uterus, ovaries, adnexal regions, and pelvic cul-de-sac.  It was necessary to proceed with endovaginal exam following the transabdominal exam to visualize the uterus and endometrium.  Color and duplex Doppler ultrasound was utilized to evaluate blood flow to the ovaries.  Comparison:  06/22/2011   Findings:  Uterus:  The uterus is retroflexed.  Uterus measures 7.9 x 4.7 x 5.9 cm.  The myometrium appears mostly homogeneous.  No focal masses demonstrated.  Endometrium:  Normal endometrial stripe thickness measured at 9.7 mm.  Homogeneous echogenic appearance without fluid.  Right ovary: Right ovary measures 3.6 x 2.2 x 1.8 cm.  Normal follicular changes.  No abnormal adnexal masses.  Flow is demonstrated in the right ovary on color flow Doppler imaging.  Left ovary:   The left ovary measures 5.7 x 3.2 by 2.9 cm.  There is a dominant complex cystic structure measuring about 3.9 x 3.8 x 3.2 cm diameter.  There is a predominant anechoic circumscribed appearance with loculated adjacent solid component.  The solid component may represent adjacent hemorrhagic cyst versus hemorrhagic component within the larger cyst. Color flow Doppler imaging demonstrates flow within the left ovarian tissue and around the cyst.  No significant flow within the cyst or within the solid/hemorrhagic component.  Pulsed Doppler evaluation demonstrates normal low-resistance arterial and venous waveforms in both ovaries.  Small amount free fluid in the cul-de-sac and left adnexa.  IMPRESSION: Normal ultrasound appearance of the uterus, endometrium, and right ovary.  Complex cystic lesion with intrinsic or adjacent echogenic component probably representing hemorrhagic cyst.  Small amount of free fluid in the pelvis.  No sonographic evidence for ovarian torsion.  Original Report Authenticated By: Marlon Pel, M.D.     1. Ovarian cyst   2. Bacterial vaginosis       MDM  12:15 AM patient seen and evaluated. Patient no acute distress.   Patient feeling better after pain medications. Ultrasound demonstrates complex left ovarian cyst. Patient advised of findings and will followup with her OB/GYN provider. Will provide Rx for pain meds and antibiotics for BV.     Angus Seller, PA 02/13/12 [phone]

## 2012-02-13 NOTE — ED Notes (Signed)
Transported to US via stretcher per xray tech  

## 2012-02-13 NOTE — ED Provider Notes (Signed)
Medical screening examination/treatment/procedure(s) were performed by non-physician practitioner and as supervising physician I was immediately available for consultation/collaboration.   Syrena Burges D Quinterious Walraven, MD 02/13/12 0559 

## 2012-02-13 NOTE — Telephone Encounter (Signed)
Pt is aware.  

## 2012-02-13 NOTE — Telephone Encounter (Signed)
Pt stated she will keep her appt on 02-28-2012. Pt Ins will not be in effect until around 02-28-2012.

## 2012-02-13 NOTE — Telephone Encounter (Signed)
I have not seen this patient in the electronic medical record and I cannot find an office visit that I have seen her.  She apparently has her to reestablish appointment in May.   she should see a gynecologist  . .  Because her problem is gynecological  .  Please have her call her gynecologist for this.  If she doesn't have one   Please help her get an appt.  With one.

## 2012-02-13 NOTE — Telephone Encounter (Signed)
Pt was seen at [facility] ED yesterday and states she has 3 cysts on ovaries and is in tremendous pain and pain medication is not helping.  She has an appt to re-establish with pcp on 5/9, however requesting to be worked in asap.  Please advise if / when pt can be worked in.

## 2012-02-13 NOTE — Discharge Instructions (Signed)
Your lab tests do not show any concerning or emergent causes for your symptoms today. Your ultrasound did show you have a left ovarian cyst. You should followup with your OB/GYN specialist for continued evaluation and treatment of your cyst. You also found to have signs of a bacterial vaginosis infection. Your given antibiotics for this. Please take as prescribed.  Bacterial Vaginosis Bacterial vaginosis (BV) is a vaginal infection where the normal balance of bacteria in the vagina is disrupted. The normal balance is then replaced by an overgrowth of certain bacteria. There are several different kinds of bacteria that can cause BV. BV is the most common vaginal infection in women of childbearing age. CAUSES   The cause of BV is not fully understood. BV develops when there is an increase or imbalance of harmful bacteria.   Some activities or behaviors can upset the normal balance of bacteria in the vagina and put women at increased risk including:   Having a new sex partner or multiple sex partners.   Douching.   Using an intrauterine device (IUD) for contraception.   It is not clear what role sexual activity plays in the development of BV. However, women that have never had sexual intercourse are rarely infected with BV.  Women do not get BV from toilet seats, bedding, swimming pools or from touching objects around them.  SYMPTOMS   Grey vaginal discharge.   A fish-like odor with discharge, especially after sexual intercourse.   Itching or burning of the vagina and vulva.   Burning or pain with urination.   Some women have no signs or symptoms at all.  DIAGNOSIS  Your caregiver must examine the vagina for signs of BV. Your caregiver will perform lab tests and look at the sample of vaginal fluid through a microscope. They will look for bacteria and abnormal cells (clue cells), a pH test higher than 4.5, and a positive amine test all associated with BV.  RISKS AND COMPLICATIONS    Pelvic inflammatory disease (PID).   Infections following gynecology surgery.   Developing HIV.   Developing herpes virus.  TREATMENT  Sometimes BV will clear up without treatment. However, all women with symptoms of BV should be treated to avoid complications, especially if gynecology surgery is planned. Female partners generally do not need to be treated. However, BV may spread between female sex partners so treatment is helpful in preventing a recurrence of BV.   BV may be treated with antibiotics. The antibiotics come in either pill or vaginal cream forms. Either can be used with nonpregnant or pregnant women, but the recommended dosages differ. These antibiotics are not harmful to the baby.   BV can recur after treatment. If this happens, a second round of antibiotics will often be prescribed.   Treatment is important for pregnant women. If not treated, BV can cause a premature delivery, especially for a pregnant woman who had a premature birth in the past. All pregnant women who have symptoms of BV should be checked and treated.   For chronic reoccurrence of BV, treatment with a type of prescribed gel vaginally twice a week is helpful.  HOME CARE INSTRUCTIONS   Finish all medication as directed by your caregiver.   Do not have sex until treatment is completed.   Tell your sexual partner that you have a vaginal infection. They should see their caregiver and be treated if they have problems, such as a mild rash or itching.   Practice safe sex. Use condoms. Only  have 1 sex partner.  PREVENTION  Basic prevention steps can help reduce the risk of upsetting the natural balance of bacteria in the vagina and developing BV:  Do not have sexual intercourse (be abstinent).   Do not douche.   Use all of the medicine prescribed for treatment of BV, even if the signs and symptoms go away.   Tell your sex partner if you have BV. That way, they can be treated, if needed, to prevent  reoccurrence.  SEEK MEDICAL CARE IF:   Your symptoms are not improving after 3 days of treatment.   You have increased discharge, pain, or fever.  MAKE SURE YOU:   Understand these instructions.   Will watch your condition.   Will get help right away if you are not doing well or get worse.  FOR MORE INFORMATION  Division of STD Prevention (DSTDP), Centers for Disease Control and Prevention: [employer] American Social Health Association (ASHA): www.ashastd.org  Document Released: 10/08/2005 Document Revised: 09/27/2011 Document Reviewed: 03/31/2009 [facility] Patient Information 2012 [place], [place].  Ovarian Cyst The ovaries are small organs that are on each side of the uterus. The ovaries are the organs that produce the female hormones, estrogen and progesterone. An ovarian cyst is a sac filled with fluid that can vary in its size. It is normal for a small cyst to form in women who are in the childbearing age and who have menstrual periods. This type of cyst is called a follicle cyst that becomes an ovulation cyst (corpus luteum cyst) after it produces the women's egg. It later goes away on its own if the woman does not become pregnant. There are other kinds of ovarian cysts that may cause problems and may need to be treated. The most serious problem is a cyst with cancer. It should be noted that menopausal women who have an ovarian cyst are at a higher risk of it being a cancer cyst. They should be evaluated very quickly, thoroughly and followed closely. This is especially true in menopausal women because of the high rate of ovarian cancer in women in menopause. CAUSES AND TYPES OF OVARIAN CYSTS:  FUNCTIONAL CYST: The follicle/corpus luteum cyst is a functional cyst that occurs every month during ovulation with the menstrual cycle. They go away with the next menstrual cycle if the woman does not get pregnant. Usually, there are no symptoms with a functional cyst.   ENDOMETRIOMA CYST:  This cyst develops from the lining of the uterus tissue. This cyst gets in or on the ovary. It grows every month from the bleeding during the menstrual period. It is also called a "chocolate cyst" because it becomes filled with blood that turns brown. This cyst can cause pain in the lower abdomen during intercourse and with your menstrual period.   CYSTADENOMA CYST: This cyst develops from the cells on the outside of the ovary. They usually are not cancerous. They can get very big and cause lower abdomen pain and pain with intercourse. This type of cyst can twist on itself, cut off its blood supply and cause severe pain. It also can easily rupture and cause a lot of pain.   DERMOID CYST: This type of cyst is sometimes found in both ovaries. They are found to have different kinds of body tissue in the cyst. The tissue includes skin, teeth, hair, and/or cartilage. They usually do not have symptoms unless they get very big. Dermoid cysts are rarely cancerous.   POLYCYSTIC OVARY: This is a rare condition  with hormone problems that produces many small cysts on both ovaries. The cysts are follicle-like cysts that never produce an egg and become a corpus luteum. It can cause an increase in body weight, infertility, acne, increase in body and facial hair and lack of menstrual periods or rare menstrual periods. Many women with this problem develop type 2 diabetes. The exact cause of this problem is unknown. A polycystic ovary is rarely cancerous.   THECA LUTEIN CYST: Occurs when too much hormone (human chorionic gonadotropin) is produced and over-stimulates the ovaries to produce an egg. They are frequently seen when doctors stimulate the ovaries for invitro-fertilization (test tube babies).   LUTEOMA CYST: This cyst is seen during pregnancy. Rarely it can cause an obstruction to the birth canal during labor and delivery. They usually go away after delivery.  SYMPTOMS   Pelvic pain or pressure.   Pain during  sexual intercourse.   Increasing girth (swelling) of the abdomen.   Abnormal menstrual periods.   Increasing pain with menstrual periods.   You stop having menstrual periods and you are not pregnant.  DIAGNOSIS  The diagnosis can be made during:  Routine or annual pelvic examination (common).   Ultrasound.   X-ray of the pelvis.   CT Scan.   MRI.   Blood tests.  TREATMENT   Treatment may only be to follow the cyst monthly for 2 to 3 months with your caregiver. Many go away on their own, especially functional cysts.   May be aspirated (drained) with a long needle with ultrasound, or by laparoscopy (inserting a tube into the pelvis through a small incision).   The whole cyst can be removed by laparoscopy.   Sometimes the cyst may need to be removed through an incision in the lower abdomen.   Hormone treatment is sometimes used to help dissolve certain cysts.   Birth control pills are sometimes used to help dissolve certain cysts.  HOME CARE INSTRUCTIONS  Follow your caregiver's advice regarding:  Medicine.   Follow up visits to evaluate and treat the cyst.   You may need to come back or make an appointment with another caregiver, to find the exact cause of your cyst, if your caregiver is not a gynecologist.   Get your yearly and recommended pelvic examinations and Pap tests.   Let your caregiver know if you have had an ovarian cyst in the past.  SEEK MEDICAL CARE IF:   Your periods are late, irregular, they stop, or are painful.   Your stomach (abdomen) or pelvic pain does not go away.   Your stomach becomes larger or swollen.   You have pressure on your bladder or trouble emptying your bladder completely.   You have painful sexual intercourse.   You have feelings of fullness, pressure, or discomfort in your stomach.   You lose weight for no apparent reason.   You feel generally ill.   You become constipated.   You lose your appetite.   You develop  acne.   You have an increase in body and facial hair.   You are gaining weight, without changing your exercise and eating habits.   You think you are pregnant.  SEEK IMMEDIATE MEDICAL CARE IF:   You have increasing abdominal pain.   You feel sick to your stomach (nausea) and/or vomit.   You develop a fever that comes on suddenly.   You develop abdominal pain during a bowel movement.   Your menstrual periods become heavier than usual.  Document Released: 10/08/2005 Document Revised: 09/27/2011 Document Reviewed: 08/11/2009 [facility] Patient Information 2012 [place], [place].   RESOURCE GUIDE  Dental Problems  Patients with Medicaid: [facility] [id number]                                           [phone] [address] Phone:  [phone]                                                  Phone:  [phone]  If unable to pay or uninsured, contact:  [facility] or [facility]. to become qualified for the adult dental clinic.  Chronic Pain Problems Contact [facility]  [phone] Patients need to be referred by their primary care doctor.  Insufficient Money for Medicine Contact [employer]:  call "211" or [facility] (226)235-3496.  No Primary Care Doctor Call [facility]  442-680-2288 Other agencies that provide inexpensive medical care    [facility]  [phone]    [facility]  [phone]    [facility]  [phone]    [facility]  [phone]    [facility]  [phone]    [facility]  [phone]  Psychological Services [facility] Behavioral Health  [phone] [facility] Services  ([phone] [facility] Mental Health   7372571670 (emergency services 4387543935)  Substance Abuse Resources [facility]  2700934598 [facility] [phone] [facility] [phone] Floydene Flock  [phone] Residential & Outpatient Substance Abuse Program  [phone]  Abuse/Neglect [facility] Child Abuse Hotline (807)056-2150 [facility] Child Abuse Hotline (207)322-7922 (After Hours)  Emergency Shelter [facility] [phone]  Maternity Homes [facility] [place] [facility] [place] 680 186 6879 [facility] Services (936)340-8972  MRSA Hotline #:   936-850-1353    [facility] Resources  [facility]     [employer]                          [facility] Dept. [address] [place]                       [address]      [address]  [place]                                                Cristobal Goldmann Phone:  [phone]                                   Phone:  [phone]  Phone:  [phone]  [facility] Mental Health Phone:  [phone]  [facility] Child Abuse Hotline 812-885-4884 (513)363-7203 (After Hours)  Ovarian Cyst The ovaries are small organs that are on each side of the uterus. The ovaries are the organs that produce the female hormones, estrogen and progesterone. An ovarian cyst is a sac filled with fluid that can vary in its size. It is normal for a small cyst to form in women who are in the childbearing age and who have menstrual periods. This type of cyst is called a follicle cyst that becomes an ovulation cyst (corpus luteum cyst) after it produces the women's egg. It later goes away on its own if the woman does not become pregnant. There are other kinds of ovarian cysts that may cause problems and may need to be treated. The most serious problem is a cyst with cancer. It should be noted that menopausal women who have an ovarian cyst are at a higher risk of it being a cancer cyst. They should be evaluated very quickly, thoroughly and followed closely. This is especially true in menopausal women because of the high rate of  ovarian cancer in women in menopause. CAUSES AND TYPES OF OVARIAN CYSTS:  FUNCTIONAL CYST: The follicle/corpus luteum cyst is a functional cyst that occurs every month during ovulation with the menstrual cycle. They go away with the next menstrual cycle if the woman does not get pregnant. Usually, there are no symptoms with a functional cyst.   ENDOMETRIOMA CYST: This cyst develops from the lining of the uterus tissue. This cyst gets in or on the ovary. It grows every month from the bleeding during the menstrual period. It is also called a "chocolate cyst" because it becomes filled with blood that turns brown. This cyst can cause pain in the lower abdomen during intercourse and with your menstrual period.   CYSTADENOMA CYST: This cyst develops from the cells on the outside of the ovary. They usually are not cancerous. They can get very big and cause lower abdomen pain and pain with intercourse. This type of cyst can twist on itself, cut off its blood supply and cause severe pain. It also can easily rupture and cause a lot of pain.   DERMOID CYST: This type of cyst is sometimes found in both ovaries. They are found to have different kinds of body tissue in the cyst. The tissue includes skin, teeth, hair, and/or cartilage. They usually do not have symptoms unless they get very big. Dermoid cysts are rarely cancerous.   POLYCYSTIC OVARY: This is a rare condition with hormone problems that produces many small cysts on both ovaries. The cysts are follicle-like cysts that never produce an egg and become a corpus luteum. It can cause an increase in body weight, infertility, acne, increase in body and facial hair and lack of menstrual periods or rare menstrual periods. Many women with this problem develop type 2 diabetes. The exact cause of this problem is unknown. A polycystic ovary is rarely cancerous.   THECA LUTEIN CYST: Occurs when too much hormone (human chorionic gonadotropin) is produced and  over-stimulates the ovaries to produce an egg. They are frequently seen when doctors stimulate the ovaries for invitro-fertilization (test tube babies).   LUTEOMA CYST: This cyst is seen during pregnancy. Rarely it can cause an obstruction to the birth canal during labor and delivery. They usually go away after delivery.  SYMPTOMS   Pelvic pain or pressure.   Pain during sexual intercourse.  Increasing girth (swelling) of the abdomen.   Abnormal menstrual periods.   Increasing pain with menstrual periods.   You stop having menstrual periods and you are not pregnant.  DIAGNOSIS  The diagnosis can be made during:  Routine or annual pelvic examination (common).   Ultrasound.   X-ray of the pelvis.   CT Scan.   MRI.   Blood tests.  TREATMENT   Treatment may only be to follow the cyst monthly for 2 to 3 months with your caregiver. Many go away on their own, especially functional cysts.   May be aspirated (drained) with a long needle with ultrasound, or by laparoscopy (inserting a tube into the pelvis through a small incision).   The whole cyst can be removed by laparoscopy.   Sometimes the cyst may need to be removed through an incision in the lower abdomen.   Hormone treatment is sometimes used to help dissolve certain cysts.   Birth control pills are sometimes used to help dissolve certain cysts.  HOME CARE INSTRUCTIONS  Follow your caregiver's advice regarding:  Medicine.   Follow up visits to evaluate and treat the cyst.   You may need to come back or make an appointment with another caregiver, to find the exact cause of your cyst, if your caregiver is not a gynecologist.   Get your yearly and recommended pelvic examinations and Pap tests.   Let your caregiver know if you have had an ovarian cyst in the past.  SEEK MEDICAL CARE IF:   Your periods are late, irregular, they stop, or are painful.   Your stomach (abdomen) or pelvic pain does not go away.    Your stomach becomes larger or swollen.   You have pressure on your bladder or trouble emptying your bladder completely.   You have painful sexual intercourse.   You have feelings of fullness, pressure, or discomfort in your stomach.   You lose weight for no apparent reason.   You feel generally ill.   You become constipated.   You lose your appetite.   You develop acne.   You have an increase in body and facial hair.   You are gaining weight, without changing your exercise and eating habits.   You think you are pregnant.  SEEK IMMEDIATE MEDICAL CARE IF:   You have increasing abdominal pain.   You feel sick to your stomach (nausea) and/or vomit.   You develop a fever that comes on suddenly.   You develop abdominal pain during a bowel movement.   Your menstrual periods become heavier than usual.  Document Released: 10/08/2005 Document Revised: 09/27/2011 Document Reviewed: 08/11/2009 [facility] Patient Information 2012 [place], [place].

## 2012-02-14 ENCOUNTER — Encounter (HOSPITAL_COMMUNITY): Payer: Self-pay | Admitting: *Deleted

## 2012-02-14 ENCOUNTER — Emergency Department (HOSPITAL_COMMUNITY)
Admission: EM | Admit: 2012-02-14 | Discharge: 2012-02-14 | Disposition: A | Discharge status: Home / Self Care | Payer: Self-pay | Attending: Emergency Medicine | Admitting: Emergency Medicine

## 2012-02-14 DIAGNOSIS — M79609 Pain in unspecified limb: Secondary | ICD-10-CM | POA: Insufficient documentation

## 2012-02-14 DIAGNOSIS — R1032 Left lower quadrant pain: Secondary | ICD-10-CM | POA: Insufficient documentation

## 2012-02-14 DIAGNOSIS — N83209 Unspecified ovarian cyst, unspecified side: Secondary | ICD-10-CM | POA: Insufficient documentation

## 2012-02-14 DIAGNOSIS — N83299 Other ovarian cyst, unspecified side: Secondary | ICD-10-CM

## 2012-02-14 DIAGNOSIS — J45909 Unspecified asthma, uncomplicated: Secondary | ICD-10-CM | POA: Insufficient documentation

## 2012-02-14 DIAGNOSIS — F17200 Nicotine dependence, unspecified, uncomplicated: Secondary | ICD-10-CM | POA: Insufficient documentation

## 2012-02-14 DIAGNOSIS — F411 Generalized anxiety disorder: Secondary | ICD-10-CM | POA: Insufficient documentation

## 2012-02-14 LAB — GC/CHLAMYDIA PROBE AMP, GENITAL
Chlamydia, DNA Probe: NEGATIVE
GC Probe Amp, Genital: NEGATIVE

## 2012-02-14 MED ORDER — KETOROLAC TROMETHAMINE 60 MG/2ML IM SOLN
60.0000 mg | Freq: Once | INTRAMUSCULAR | Status: AC
  Administered 2012-02-14: 60 mg via INTRAMUSCULAR
  Filled 2012-02-14: qty 2

## 2012-02-14 NOTE — ED Notes (Signed)
Pt states "I came back today because I'm tired of the pain, today the pain went down in my leg"; pt indicates pain to LLQ & into LLE.

## 2012-02-14 NOTE — ED Provider Notes (Signed)
History     CSN: [id number]  Arrival date & time 02/14/12  1429   First MD Initiated Contact with Patient 02/14/12 1621      Chief Complaint  Patient presents with  . Abdominal Pain    seen @ [facility] x 2 days ago; US done.    (Consider location/radiation/quality/duration/timing/severity/associated sxs/prior treatment) HPI  26 year old female with past history of anxiety, ovarian cysts, fibroids of the uterus presents with complaints of increasing left lower quadrant abdominal pain for the past 6 days. She describes pain as sharp and stabbing sensation that started from a left lower quadrant and radiates down to her left leg. Pain is constant sometimes worse with movement. She denies any recent strenuous exercise or heavy lifting. She denies any urinary symptoms. Denies fever, chills, chest pain or shortness of breath. Patient states she has been taking ibuprofen with some relief but now the pain is getting progressively worse. Pt was seen and evaluated in ED yesterday for same. Ultrasound demonstrates complex left ovarian cyst. Patient advised of findings and will followup with her OB/GYN provider. Pt were prescribed pain meds and antibiotics for BV. The patient states she went home and took one dose of Norco but states that it only makes her sleepy. Therefore, she returns requesting for further evaluation. Patient mentioned that she has called to set up an appointment with her primary care Dr. and with the OB/GYN but the appointment is not until several weeks from now.   Past Medical History  Diagnosis Date  . HPV in female   . Anxiety   . Asthma     Past Surgical History  Procedure Date  . Hernia repair   . Ovarian cyst removal   . Wisdom tooth extraction     No family history on file.  History  Substance Use Topics  . Smoking status: Current Everyday Smoker -- 0.2 packs/day  . Smokeless tobacco: Not on file  . Alcohol Use: No    OB History    Grav Para Term Preterm  Abortions TAB SAB Ect Mult Living   1    1  1          Review of Systems  All other systems reviewed and are negative.    Allergies  Morphine and related; Lamictal; and Latex  Home Medications   Current Outpatient Rx  Name Route Sig Dispense Refill  . HYDROCODONE-ACETAMINOPHEN 5-325 MG PO TABS Oral Take 1-2 tablets by mouth every 4 (four) hours as needed for pain. 20 tablet 0  . IBUPROFEN 200 MG PO TABS Oral Take 400 mg by mouth every 6 (six) hours as needed. For cramping    . METRONIDAZOLE 500 MG PO TABS Oral Take 1 tablet (500 mg total) by mouth 2 (two) times daily. 14 tablet 0    BP 97/68  Pulse 87  Temp(Src) 98.4 F (36.9 C) (Oral)  Resp 18  Wt 134 lb 9.6 oz (61.054 kg)  SpO2 99%  LMP 01/21/2012  Physical Exam  Nursing note and vitals reviewed. Constitutional: She appears well-developed and well-nourished. No distress.  HENT:  Head: Atraumatic.  Eyes: Conjunctivae are normal.  Neck: Neck supple.  Cardiovascular: Normal rate and regular rhythm.   Pulmonary/Chest: Effort normal and breath sounds normal. No respiratory distress. She exhibits no tenderness.  Abdominal: Soft. There is no hepatosplenomegaly. There is tenderness in the left lower quadrant. There is no rigidity, no guarding, no CVA tenderness, no tenderness at McBurney's point and negative Murphy's sign. No hernia. Hernia  confirmed negative in the ventral area.  Musculoskeletal: Normal range of motion. She exhibits no edema and no tenderness.  Neurological: She is alert.  Skin: Skin is warm.  Psychiatric: She has a normal mood and affect.    ED Course  Procedures (including critical care time)   Labs Reviewed - No data to display US Transvaginal Non-ob  02/13/2012  *RADIOLOGY REPORT*  Clinical Data:  Left ovarian pain for 5 days.  History of cysts and fibroids.  TRANSABDOMINAL AND TRANSVAGINAL ULTRASOUND OF PELVIS DOPPLER ULTRASOUND OF OVARIES  Technique:  Both transabdominal and transvaginal  ultrasound examinations of the pelvis were performed. Transabdominal technique was performed for global imaging of the pelvis including uterus, ovaries, adnexal regions, and pelvic cul-de-sac.  It was necessary to proceed with endovaginal exam following the transabdominal exam to visualize the uterus and endometrium.  Color and duplex Doppler ultrasound was utilized to evaluate blood flow to the ovaries.  Comparison:  06/22/2011  Findings:  Uterus:  The uterus is retroflexed.  Uterus measures 7.9 x 4.7 x 5.9 cm.  The myometrium appears mostly homogeneous.  No focal masses demonstrated.  Endometrium:  Normal endometrial stripe thickness measured at 9.7 mm.  Homogeneous echogenic appearance without fluid.  Right ovary: Right ovary measures 3.6 x 2.2 x 1.8 cm.  Normal follicular changes.  No abnormal adnexal masses.  Flow is demonstrated in the right ovary on color flow Doppler imaging.  Left ovary:   The left ovary measures 5.7 x 3.2 by 2.9 cm.  There is a dominant complex cystic structure measuring about 3.9 x 3.8 x 3.2 cm diameter.  There is a predominant anechoic circumscribed appearance with loculated adjacent solid component.  The solid component may represent adjacent hemorrhagic cyst versus hemorrhagic component within the larger cyst. Color flow Doppler imaging demonstrates flow within the left ovarian tissue and around the cyst.  No significant flow within the cyst or within the solid/hemorrhagic component.  Pulsed Doppler evaluation demonstrates normal low-resistance arterial and venous waveforms in both ovaries.  Small amount free fluid in the cul-de-sac and left adnexa.  IMPRESSION: Normal ultrasound appearance of the uterus, endometrium, and right ovary.  Complex cystic lesion with intrinsic or adjacent echogenic component probably representing hemorrhagic cyst.  Small amount of free fluid in the pelvis.  No sonographic evidence for ovarian torsion.  Original Report Authenticated By: Marlon Pel,  M.D.   US Pelvis Complete  02/13/2012  *RADIOLOGY REPORT*  Clinical Data:  Left ovarian pain for 5 days.  History of cysts and fibroids.  TRANSABDOMINAL AND TRANSVAGINAL ULTRASOUND OF PELVIS DOPPLER ULTRASOUND OF OVARIES  Technique:  Both transabdominal and transvaginal ultrasound examinations of the pelvis were performed. Transabdominal technique was performed for global imaging of the pelvis including uterus, ovaries, adnexal regions, and pelvic cul-de-sac.  It was necessary to proceed with endovaginal exam following the transabdominal exam to visualize the uterus and endometrium.  Color and duplex Doppler ultrasound was utilized to evaluate blood flow to the ovaries.  Comparison:  06/22/2011  Findings:  Uterus:  The uterus is retroflexed.  Uterus measures 7.9 x 4.7 x 5.9 cm.  The myometrium appears mostly homogeneous.  No focal masses demonstrated.  Endometrium:  Normal endometrial stripe thickness measured at 9.7 mm.  Homogeneous echogenic appearance without fluid.  Right ovary: Right ovary measures 3.6 x 2.2 x 1.8 cm.  Normal follicular changes.  No abnormal adnexal masses.  Flow is demonstrated in the right ovary on color flow Doppler imaging.  Left ovary:  The left ovary measures 5.7 x 3.2 by 2.9 cm.  There is a dominant complex cystic structure measuring about 3.9 x 3.8 x 3.2 cm diameter.  There is a predominant anechoic circumscribed appearance with loculated adjacent solid component.  The solid component may represent adjacent hemorrhagic cyst versus hemorrhagic component within the larger cyst. Color flow Doppler imaging demonstrates flow within the left ovarian tissue and around the cyst.  No significant flow within the cyst or within the solid/hemorrhagic component.  Pulsed Doppler evaluation demonstrates normal low-resistance arterial and venous waveforms in both ovaries.  Small amount free fluid in the cul-de-sac and left adnexa.  IMPRESSION: Normal ultrasound appearance of the uterus,  endometrium, and right ovary.  Complex cystic lesion with intrinsic or adjacent echogenic component probably representing hemorrhagic cyst.  Small amount of free fluid in the pelvis.  No sonographic evidence for ovarian torsion.  Original Report Authenticated By: Marlon Pel, M.D.   [place] Art/ven Flow Abd Pelv Doppler  02/13/2012  *RADIOLOGY REPORT*  Clinical Data:  Left ovarian pain for 5 days.  History of cysts and fibroids.  TRANSABDOMINAL AND TRANSVAGINAL ULTRASOUND OF PELVIS DOPPLER ULTRASOUND OF OVARIES  Technique:  Both transabdominal and transvaginal ultrasound examinations of the pelvis were performed. Transabdominal technique was performed for global imaging of the pelvis including uterus, ovaries, adnexal regions, and pelvic cul-de-sac.  It was necessary to proceed with endovaginal exam following the transabdominal exam to visualize the uterus and endometrium.  Color and duplex Doppler ultrasound was utilized to evaluate blood flow to the ovaries.  Comparison:  06/22/2011  Findings:  Uterus:  The uterus is retroflexed.  Uterus measures 7.9 x 4.7 x 5.9 cm.  The myometrium appears mostly homogeneous.  No focal masses demonstrated.  Endometrium:  Normal endometrial stripe thickness measured at 9.7 mm.  Homogeneous echogenic appearance without fluid.  Right ovary: Right ovary measures 3.6 x 2.2 x 1.8 cm.  Normal follicular changes.  No abnormal adnexal masses.  Flow is demonstrated in the right ovary on color flow Doppler imaging.  Left ovary:   The left ovary measures 5.7 x 3.2 by 2.9 cm.  There is a dominant complex cystic structure measuring about 3.9 x 3.8 x 3.2 cm diameter.  There is a predominant anechoic circumscribed appearance with loculated adjacent solid component.  The solid component may represent adjacent hemorrhagic cyst versus hemorrhagic component within the larger cyst. Color flow Doppler imaging demonstrates flow within the left ovarian tissue and around the cyst.  No significant  flow within the cyst or within the solid/hemorrhagic component.  Pulsed Doppler evaluation demonstrates normal low-resistance arterial and venous waveforms in both ovaries.  Small amount free fluid in the cul-de-sac and left adnexa.  IMPRESSION: Normal ultrasound appearance of the uterus, endometrium, and right ovary.  Complex cystic lesion with intrinsic or adjacent echogenic component probably representing hemorrhagic cyst.  Small amount of free fluid in the pelvis.  No sonographic evidence for ovarian torsion.  Original Report Authenticated By: Marlon Pel, M.D.     No diagnosis found.    MDM  Patient has reproducible pain to the left lower quadrant. Nonsurgical abdomen. Upon reviewing her prior visit, she has normal white count. She is afebrile with stable normal vital signs. Her most recent pelvic ultrasound shows a complex left ovarian cyst with a small amount of free fluid in the pelvic. I do believe that this is the source of her pain. I have low suspicion for sciatica related pain. No evidence of rash.  Plan to give patient Toradol 60 IM here for pain control.   5:26 PM Pt sts pain has improved with Toradol but has not totally resolved.  Pt OK with further pain management at home and f/u with her PCP.  Will d/c.      Fayrene Helper, PA-C 02/14/12 1727

## 2012-02-14 NOTE — Discharge Instructions (Signed)
Ovarian Cyst The ovaries are small organs that are on each side of the uterus. The ovaries are the organs that produce the female hormones, estrogen and progesterone. An ovarian cyst is a sac filled with fluid that can vary in its size. It is normal for a small cyst to form in women who are in the childbearing age and who have menstrual periods. This type of cyst is called a follicle cyst that becomes an ovulation cyst (corpus luteum cyst) after it produces the women's egg. It later goes away on its own if the woman does not become pregnant. There are other kinds of ovarian cysts that may cause problems and may need to be treated. The most serious problem is a cyst with cancer. It should be noted that menopausal women who have an ovarian cyst are at a higher risk of it being a cancer cyst. They should be evaluated very quickly, thoroughly and followed closely. This is especially true in menopausal women because of the high rate of ovarian cancer in women in menopause. CAUSES AND TYPES OF OVARIAN CYSTS:  FUNCTIONAL CYST: The follicle/corpus luteum cyst is a functional cyst that occurs every month during ovulation with the menstrual cycle. They go away with the next menstrual cycle if the woman does not get pregnant. Usually, there are no symptoms with a functional cyst.   ENDOMETRIOMA CYST: This cyst develops from the lining of the uterus tissue. This cyst gets in or on the ovary. It grows every month from the bleeding during the menstrual period. It is also called a "chocolate cyst" because it becomes filled with blood that turns brown. This cyst can cause pain in the lower abdomen during intercourse and with your menstrual period.   CYSTADENOMA CYST: This cyst develops from the cells on the outside of the ovary. They usually are not cancerous. They can get very big and cause lower abdomen pain and pain with intercourse. This type of cyst can twist on itself, cut off its blood supply and cause severe pain.  It also can easily rupture and cause a lot of pain.   DERMOID CYST: This type of cyst is sometimes found in both ovaries. They are found to have different kinds of body tissue in the cyst. The tissue includes skin, teeth, hair, and/or cartilage. They usually do not have symptoms unless they get very big. Dermoid cysts are rarely cancerous.   POLYCYSTIC OVARY: This is a rare condition with hormone problems that produces many small cysts on both ovaries. The cysts are follicle-like cysts that never produce an egg and become a corpus luteum. It can cause an increase in body weight, infertility, acne, increase in body and facial hair and lack of menstrual periods or rare menstrual periods. Many women with this problem develop type 2 diabetes. The exact cause of this problem is unknown. A polycystic ovary is rarely cancerous.   THECA LUTEIN CYST: Occurs when too much hormone (human chorionic gonadotropin) is produced and over-stimulates the ovaries to produce an egg. They are frequently seen when doctors stimulate the ovaries for invitro-fertilization (test tube babies).   LUTEOMA CYST: This cyst is seen during pregnancy. Rarely it can cause an obstruction to the birth canal during labor and delivery. They usually go away after delivery.  SYMPTOMS   Pelvic pain or pressure.   Pain during sexual intercourse.   Increasing girth (swelling) of the abdomen.   Abnormal menstrual periods.   Increasing pain with menstrual periods.   You stop having   menstrual periods and you are not pregnant.  DIAGNOSIS  The diagnosis can be made during:  Routine or annual pelvic examination (common).   Ultrasound.   X-ray of the pelvis.   CT Scan.   MRI.   Blood tests.  TREATMENT   Treatment may only be to follow the cyst monthly for 2 to 3 months with your caregiver. Many go away on their own, especially functional cysts.   May be aspirated (drained) with a long needle with ultrasound, or by laparoscopy  (inserting a tube into the pelvis through a small incision).   The whole cyst can be removed by laparoscopy.   Sometimes the cyst may need to be removed through an incision in the lower abdomen.   Hormone treatment is sometimes used to help dissolve certain cysts.   Birth control pills are sometimes used to help dissolve certain cysts.  HOME CARE INSTRUCTIONS  Follow your caregiver's advice regarding:  Medicine.   Follow up visits to evaluate and treat the cyst.   You may need to come back or make an appointment with another caregiver, to find the exact cause of your cyst, if your caregiver is not a gynecologist.   Get your yearly and recommended pelvic examinations and Pap tests.   Let your caregiver know if you have had an ovarian cyst in the past.  SEEK MEDICAL CARE IF:   Your periods are late, irregular, they stop, or are painful.   Your stomach (abdomen) or pelvic pain does not go away.   Your stomach becomes larger or swollen.   You have pressure on your bladder or trouble emptying your bladder completely.   You have painful sexual intercourse.   You have feelings of fullness, pressure, or discomfort in your stomach.   You lose weight for no apparent reason.   You feel generally ill.   You become constipated.   You lose your appetite.   You develop acne.   You have an increase in body and facial hair.   You are gaining weight, without changing your exercise and eating habits.   You think you are pregnant.  SEEK IMMEDIATE MEDICAL CARE IF:   You have increasing abdominal pain.   You feel sick to your stomach (nausea) and/or vomit.   You develop a fever that comes on suddenly.   You develop abdominal pain during a bowel movement.   Your menstrual periods become heavier than usual.  Document Released: 10/08/2005 Document Revised: 09/27/2011 Document Reviewed: 08/11/2009 [employer] Patient Information 2012 [employer]. 

## 2012-02-15 NOTE — ED Provider Notes (Signed)
Medical screening examination/treatment/procedure(s) were performed by non-physician practitioner and as supervising physician I was immediately available for consultation/collaboration.  Cheri Guppy, MD 02/15/12 1510

## 2012-02-28 ENCOUNTER — Encounter: Payer: Self-pay | Admitting: Internal Medicine

## 2012-02-28 DIAGNOSIS — Z0289 Encounter for other administrative examinations: Secondary | ICD-10-CM

## 2012-02-28 DIAGNOSIS — Z9289 Personal history of other medical treatment: Secondary | ICD-10-CM | POA: Insufficient documentation

## 2012-03-20 ENCOUNTER — Encounter: Payer: Self-pay | Admitting: Obstetrics & Gynecology

## 2012-09-17 ENCOUNTER — Emergency Department (HOSPITAL_COMMUNITY): Payer: Self-pay

## 2012-09-17 ENCOUNTER — Encounter (HOSPITAL_COMMUNITY): Payer: Self-pay | Admitting: Emergency Medicine

## 2012-09-17 ENCOUNTER — Emergency Department (HOSPITAL_COMMUNITY)
Admission: EM | Admit: 2012-09-17 | Discharge: 2012-09-17 | Disposition: A | Discharge status: Home / Self Care | Payer: Self-pay | Attending: Emergency Medicine | Admitting: Emergency Medicine

## 2012-09-17 DIAGNOSIS — F17200 Nicotine dependence, unspecified, uncomplicated: Secondary | ICD-10-CM | POA: Insufficient documentation

## 2012-09-17 DIAGNOSIS — Z3202 Encounter for pregnancy test, result negative: Secondary | ICD-10-CM | POA: Insufficient documentation

## 2012-09-17 DIAGNOSIS — K5900 Constipation, unspecified: Secondary | ICD-10-CM | POA: Insufficient documentation

## 2012-09-17 DIAGNOSIS — Z87898 Personal history of other specified conditions: Secondary | ICD-10-CM | POA: Insufficient documentation

## 2012-09-17 DIAGNOSIS — Z8659 Personal history of other mental and behavioral disorders: Secondary | ICD-10-CM | POA: Insufficient documentation

## 2012-09-17 DIAGNOSIS — L509 Urticaria, unspecified: Secondary | ICD-10-CM | POA: Insufficient documentation

## 2012-09-17 LAB — CBC WITH DIFFERENTIAL/PLATELET
Basophils Relative: 1 % (ref 0–1)
Eosinophils Absolute: 0.3 10*3/uL (ref 0.0–0.7)
Eosinophils Relative: 5 % (ref 0–5)
HCT: 35.3 % — ABNORMAL LOW (ref 36.0–46.0)
Hemoglobin: 11.9 g/dL — ABNORMAL LOW (ref 12.0–15.0)
Lymphs Abs: 2.3 10*3/uL (ref 0.7–4.0)
MCH: 30 pg (ref 26.0–34.0)
MCHC: 33.7 g/dL (ref 30.0–36.0)
MCV: 88.9 fL (ref 78.0–100.0)
Monocytes Absolute: 0.6 10*3/uL (ref 0.1–1.0)
Monocytes Relative: 9 % (ref 3–12)
RBC: 3.97 MIL/uL (ref 3.87–5.11)

## 2012-09-17 LAB — COMPREHENSIVE METABOLIC PANEL
Alkaline Phosphatase: 44 U/L (ref 39–117)
BUN: 15 mg/dL (ref 6–23)
Creatinine, Ser: 0.71 mg/dL (ref 0.50–1.10)
GFR calc Af Amer: >90 mL/min (ref >90)
Glucose, Bld: 87 mg/dL (ref 70–99)
Potassium: 3.3 mEq/L — ABNORMAL LOW (ref 3.5–5.1)
Total Protein: 7.5 g/dL (ref 6.0–8.3)

## 2012-09-17 LAB — URINALYSIS, ROUTINE W REFLEX MICROSCOPIC
Bilirubin Urine: NEGATIVE
Ketones, ur: NEGATIVE mg/dL
Leukocytes, UA: NEGATIVE
Nitrite: NEGATIVE
Specific Gravity, Urine: 1.027 (ref 1.005–1.030)
Urobilinogen, UA: 1 mg/dL (ref 0.0–1.0)

## 2012-09-17 LAB — LIPASE, BLOOD: Lipase: 32 U/L (ref 11–59)

## 2012-09-17 MED ORDER — SODIUM CHLORIDE 0.9 % IV SOLN
Freq: Once | INTRAVENOUS | Status: AC
  Administered 2012-09-17: 22:00:00 via INTRAVENOUS

## 2012-09-17 MED ORDER — FAMOTIDINE IN NACL 20-0.9 MG/50ML-% IV SOLN
20.0000 mg | Freq: Once | INTRAVENOUS | Status: AC
  Administered 2012-09-17: 20 mg via INTRAVENOUS
  Filled 2012-09-17: qty 50

## 2012-09-17 MED ORDER — DIPHENHYDRAMINE HCL 25 MG PO CAPS
50.0000 mg | ORAL_CAPSULE | Freq: Once | ORAL | Status: AC
  Administered 2012-09-17: 50 mg via ORAL
  Filled 2012-09-17 (×2): qty 1

## 2012-09-17 MED ORDER — POLYETHYLENE GLYCOL 3350 17 G PO PACK: 17.0000 g | PACK | Freq: Every day | ORAL | Status: DC

## 2012-09-17 NOTE — ED Notes (Signed)
Patient transported to X-ray 

## 2012-09-17 NOTE — ED Provider Notes (Signed)
History     CSN: [id number]  Arrival date & time 09/17/12  1928   First MD Initiated Contact with Patient 09/17/12 1957      Chief Complaint  Patient presents with  . Abdominal Pain  . Rash    (Consider location/radiation/quality/duration/timing/severity/associated sxs/prior treatment) Patient is a 26 y.o. female presenting with abdominal pain. The history is provided by the patient. No language interpreter was used.  Abdominal Pain The primary symptoms of the illness include abdominal pain. Episode onset: 1 month. The onset of the illness was gradual. The problem has been gradually worsening.  Associated with: constipation. The patient states that she believes she is currently not pregnant. Additional symptoms associated with the illness include constipation. Symptoms associated with the illness do not include chills or back pain. Significant associated medical issues do not include inflammatory bowel disease, diabetes or gallstones.   Pt complains of left lower abdominal pain for the past month.  Pt also reports she has been constipated for a month.  Pt complains of a rash full body.  Pt reports similar rash in the past Past Medical History  Diagnosis Date  . HPV in female   . Anxiety   . Asthma   . H/O exercise stress test 2009    nl echo also  Irbbb  . H/O major depression 2008     poss bipolar  Dr Dub Mikes    Past Surgical History  Procedure Date  . Hernia repair   . Ovarian cyst removal   . Wisdom tooth extraction     History reviewed. No pertinent family history.  History  Substance Use Topics  . Smoking status: Current Every Day Smoker -- 0.2 packs/day  . Smokeless tobacco: Not on file  . Alcohol Use: No    OB History    Grav Para Term Preterm Abortions TAB SAB Ect Mult Living   1    1  1          Review of Systems  Constitutional: Negative for chills.  Gastrointestinal: Positive for abdominal pain and constipation.  Musculoskeletal: Negative for back  pain.  All other systems reviewed and are negative.    Allergies  Morphine and related; Lamictal; and Latex  Home Medications   Current Outpatient Rx  Name  Route  Sig  Dispense  Refill  . IBUPROFEN 200 MG PO TABS   Oral   Take 400 mg by mouth every 6 (six) hours as needed. For cramping           BP 130/85  Pulse 70  Temp 98.3 F (36.8 C) (Oral)  Resp 18  SpO2 100%  LMP 08/21/2012  Physical Exam  Nursing note and vitals reviewed. Constitutional: She is oriented to person, place, and time. She appears well-developed and well-nourished.  HENT:  Head: Normocephalic and atraumatic.  Right Ear: External ear normal.  Left Ear: External ear normal.  Nose: Nose normal.  Mouth/Throat: Oropharynx is clear and moist.  Eyes: Conjunctivae normal and EOM are normal. Pupils are equal, round, and reactive to light.  Neck: Normal range of motion. Neck supple.  Cardiovascular: Normal rate.   Pulmonary/Chest: Effort normal.  Abdominal: Soft. There is tenderness.       Tender left lower quadrant.  Musculoskeletal: Normal range of motion.  Neurological: She is alert and oriented to person, place, and time. She has normal reflexes.  Skin: Skin is warm.       erythemaotus raised rash  Looks like hives  Psychiatric: She has  a normal mood and affect.    ED Course  Procedures (including critical care time)   Labs Reviewed  CBC WITH DIFFERENTIAL  COMPREHENSIVE METABOLIC PANEL  LIPASE, BLOOD  URINALYSIS, ROUTINE W REFLEX MICROSCOPIC   No results found.   No diagnosis found.    MDM     Results for orders placed during the hospital encounter of 09/17/12  CBC WITH DIFFERENTIAL      Component Value Range   WBC 6.2  4.0 - 10.5 K/uL   RBC 3.97  3.87 - 5.11 MIL/uL   Hemoglobin 11.9 (*) 12.0 - 15.0 g/dL   HCT 09.8 (*) 11.9 - 14.7 %   MCV 88.9  78.0 - 100.0 fL   MCH 30.0  26.0 - 34.0 pg   MCHC 33.7  30.0 - 36.0 g/dL   RDW 82.9  56.2 - 13.0 %   Platelets 199  150 - 400  K/uL   Neutrophils Relative 48  43 - 77 %   Neutro Abs 3.0  1.7 - 7.7 K/uL   Lymphocytes Relative 37  12 - 46 %   Lymphs Abs 2.3  0.7 - 4.0 K/uL   Monocytes Relative 9  3 - 12 %   Monocytes Absolute 0.6  0.1 - 1.0 K/uL   Eosinophils Relative 5  0 - 5 %   Eosinophils Absolute 0.3  0.0 - 0.7 K/uL   Basophils Relative 1  0 - 1 %   Basophils Absolute 0.0  0.0 - 0.1 K/uL  URINALYSIS, ROUTINE W REFLEX MICROSCOPIC      Component Value Range   Color, Urine YELLOW  YELLOW   APPearance HAZY (*) CLEAR   Specific Gravity, Urine 1.027  1.005 - 1.030   pH 7.5  5.0 - 8.0   Glucose, UA NEGATIVE  NEGATIVE mg/dL   Hgb urine dipstick NEGATIVE  NEGATIVE   Bilirubin Urine NEGATIVE  NEGATIVE   Ketones, ur NEGATIVE  NEGATIVE mg/dL   Protein, ur NEGATIVE  NEGATIVE mg/dL   Urobilinogen, UA 1.0  0.0 - 1.0 mg/dL   Nitrite NEGATIVE  NEGATIVE   Leukocytes, UA NEGATIVE  NEGATIVE  POCT PREGNANCY, URINE      Component Value Range   Preg Test, Ur NEGATIVE  NEGATIVE   Dg Abd 1 View  09/17/2012  *RADIOLOGY REPORT*  Clinical Data: Lower abdominal pain.  Constipation.  ABDOMEN - 1 VIEW  Comparison: No priors.  Findings: Two views of the abdomen demonstrate a large amount of stool scattered throughout the colon extending to the level of the distal rectum.  No pathologic distension of small bowel.  No pneumoperitoneum.  IMPRESSION: 1.  Large volume of stool throughout the colon and rectum compatible with severe constipation.   Original Report Authenticated By: Trudie Reed, M.D.     Pt given benadryl and pepcid for rash.   Pt's kub shows a large amount of stool.  Pt given rx for miralax.  Pt advised to take benadryl at home   Lonia Skinner [place], [place] 09/17/12 2200

## 2012-09-17 NOTE — ED Notes (Signed)
Pt c/o intermittent numbness and tingling in all extremities x25month.  Pt states "sometimes my left leg just gives out when it goes numb".  Pt has full ROM of all extremities, denies numbness/tingling at this time and is in NAD.

## 2012-09-17 NOTE — ED Notes (Signed)
Patient complaining of intermittent abdominal pain/pressure in the left lower quadrant that started around the first of this month.  Patient reports intermittent nausea and vomiting; denies emesis in the last 24 hours.  Denies diarrhea; reports constipation.  Patient also complaining of urinary frequency.  Patient has bump like rash all over body that appeared today -- has had this in the past, but rash usually resolves on own.

## 2012-09-17 NOTE — ED Provider Notes (Signed)
Medical screening examination/treatment/procedure(s) were performed by non-physician practitioner and as supervising physician I was immediately available for consultation/collaboration.   Alahia Whicker, MD 09/17/12 2304 

## 2012-09-20 ENCOUNTER — Emergency Department (HOSPITAL_COMMUNITY)
Admission: EM | Admit: 2012-09-20 | Discharge: 2012-09-20 | Disposition: A | Discharge status: Home / Self Care | Payer: Self-pay | Attending: Emergency Medicine | Admitting: Emergency Medicine

## 2012-09-20 ENCOUNTER — Encounter (HOSPITAL_COMMUNITY): Payer: Self-pay | Admitting: Emergency Medicine

## 2012-09-20 DIAGNOSIS — K5900 Constipation, unspecified: Secondary | ICD-10-CM | POA: Insufficient documentation

## 2012-09-20 DIAGNOSIS — N760 Acute vaginitis: Secondary | ICD-10-CM | POA: Insufficient documentation

## 2012-09-20 DIAGNOSIS — N898 Other specified noninflammatory disorders of vagina: Secondary | ICD-10-CM | POA: Insufficient documentation

## 2012-09-20 DIAGNOSIS — J45909 Unspecified asthma, uncomplicated: Secondary | ICD-10-CM | POA: Insufficient documentation

## 2012-09-20 DIAGNOSIS — R21 Rash and other nonspecific skin eruption: Secondary | ICD-10-CM

## 2012-09-20 DIAGNOSIS — B9689 Other specified bacterial agents as the cause of diseases classified elsewhere: Secondary | ICD-10-CM

## 2012-09-20 DIAGNOSIS — Z8619 Personal history of other infectious and parasitic diseases: Secondary | ICD-10-CM | POA: Insufficient documentation

## 2012-09-20 DIAGNOSIS — L519 Erythema multiforme, unspecified: Secondary | ICD-10-CM

## 2012-09-20 DIAGNOSIS — Z79899 Other long term (current) drug therapy: Secondary | ICD-10-CM | POA: Insufficient documentation

## 2012-09-20 DIAGNOSIS — Z8659 Personal history of other mental and behavioral disorders: Secondary | ICD-10-CM | POA: Insufficient documentation

## 2012-09-20 DIAGNOSIS — Z3202 Encounter for pregnancy test, result negative: Secondary | ICD-10-CM | POA: Insufficient documentation

## 2012-09-20 DIAGNOSIS — J029 Acute pharyngitis, unspecified: Secondary | ICD-10-CM | POA: Insufficient documentation

## 2012-09-20 DIAGNOSIS — F17200 Nicotine dependence, unspecified, uncomplicated: Secondary | ICD-10-CM | POA: Insufficient documentation

## 2012-09-20 LAB — COMPREHENSIVE METABOLIC PANEL
CO2: 21 mEq/L (ref 19–32)
Calcium: 9.6 mg/dL (ref 8.4–10.5)
Creatinine, Ser: 0.8 mg/dL (ref 0.50–1.10)
GFR calc Af Amer: >90 mL/min (ref >90)
GFR calc non Af Amer: >90 mL/min (ref >90)
Glucose, Bld: 78 mg/dL (ref 70–99)

## 2012-09-20 LAB — CBC WITH DIFFERENTIAL/PLATELET
Basophils Absolute: 0 10*3/uL (ref 0.0–0.1)
Eosinophils Relative: 7 % — ABNORMAL HIGH (ref 0–5)
HCT: 35 % — ABNORMAL LOW (ref 36.0–46.0)
Lymphocytes Relative: 29 % (ref 12–46)
Lymphs Abs: 1.2 10*3/uL (ref 0.7–4.0)
MCV: 88.8 fL (ref 78.0–100.0)
Monocytes Absolute: 0.4 10*3/uL (ref 0.1–1.0)
RDW: 13.1 % (ref 11.5–15.5)
WBC: 4.2 10*3/uL (ref 4.0–10.5)

## 2012-09-20 LAB — PREGNANCY, URINE: Preg Test, Ur: NEGATIVE

## 2012-09-20 LAB — URINALYSIS, ROUTINE W REFLEX MICROSCOPIC
Glucose, UA: NEGATIVE mg/dL
Leukocytes, UA: NEGATIVE
pH: 6 (ref 5.0–8.0)

## 2012-09-20 LAB — MONONUCLEOSIS SCREEN: Mono Screen: NEGATIVE

## 2012-09-20 LAB — RAPID STREP SCREEN (MED CTR MEBANE ONLY): Streptococcus, Group A Screen (Direct): NEGATIVE

## 2012-09-20 MED ORDER — METRONIDAZOLE 500 MG PO TABS: 500.0000 mg | ORAL_TABLET | Freq: Two times a day (BID) | ORAL | Status: DC

## 2012-09-20 NOTE — ED Provider Notes (Signed)
History    CSN: [id number] Arrival date & time 09/20/12  1053 First MD Initiated Contact with Patient 09/20/12 1108     Chief Complaint  Patient presents with  . Rash  . Vaginal Discharge   Patient is a 26 y.o. female presenting with rash and vaginal discharge. The history is provided by the patient.  Rash  This is a new problem. The current episode started more than 1 week ago. The problem has not changed since onset.Maximum temperature: low grade. The rash is present on the torso (extremities, and now also on the feet). The pain is moderate. The pain has been constant since onset. Associated symptoms include pain. Pertinent negatives include no blisters, no itching and no weeping. Treatments tried: pepcid and benadryl. The treatment provided no relief.  Vaginal Discharge The current episode started more than 1 week ago. The problem occurs constantly. The problem has not changed since onset.Pertinent negatives include no chest pain, no abdominal pain, no headaches and no shortness of breath. Nothing aggravates the symptoms. Nothing relieves the symptoms.  Pt has frequent yeast infections.  She thinks this could be related.  Past Medical History  Diagnosis Date  . HPV in female   . Anxiety   . Asthma   . H/O exercise stress test 2009    nl echo also  [facility]  . H/O major depression 2008     poss bipolar  Dr Sabra Heck    Past Surgical History  Procedure Date  . Hernia repair   . Ovarian cyst removal   . Wisdom tooth extraction     History reviewed. No pertinent family history.  History  Substance Use Topics  . Smoking status: Current Every Day Smoker -- 0.2 packs/day  . Smokeless tobacco: Not on file  . Alcohol Use: No    OB History    Grav Para Term Preterm Abortions TAB SAB Ect Mult Living   1    1  1          Review of Systems  HENT: Positive for sore throat. Negative for trouble swallowing and voice change.   Respiratory: Negative for shortness of breath.     Cardiovascular: Negative for chest pain.  Gastrointestinal: Positive for constipation. Negative for nausea, vomiting and abdominal pain.  Genitourinary: Positive for vaginal discharge.  Skin: Positive for rash. Negative for itching.  Neurological: Negative for headaches.  All other systems reviewed and are negative.    Allergies  Morphine and related; Orange fruit; Lamictal; and Latex  Home Medications   Current Outpatient Rx  Name  Route  Sig  Dispense  Refill  . DIPHENHYDRAMINE HCL 25 MG PO CAPS   Oral   Take 25 mg by mouth 2 (two) times daily as needed. For hives         . FAMOTIDINE 20 MG PO TABS   Oral   Take 20 mg by mouth every morning.         Marland Kitchen POLYETHYLENE GLYCOL 3350 PO PACK   Oral   Take 17 g by mouth daily.   14 each   0     BP 125/78  Pulse 96  Temp 98.4 F (36.9 C) (Oral)  SpO2 97%  LMP 08/21/2012  Physical Exam  Nursing note and vitals reviewed. Constitutional: She appears well-developed and well-nourished. No distress.  HENT:  Head: Normocephalic and atraumatic.  Right Ear: External ear normal.  Left Ear: External ear normal.  Eyes: Conjunctivae normal are normal. Right eye exhibits no discharge.  Left eye exhibits no discharge. No scleral icterus.  Neck: Neck supple. No tracheal deviation present.  Cardiovascular: Normal rate, regular rhythm and intact distal pulses.   Pulmonary/Chest: Effort normal and breath sounds normal. No stridor. No respiratory distress. She has no wheezes. She has no rales.  Abdominal: Soft. Bowel sounds are normal. She exhibits no distension. There is no tenderness. There is no rebound and no guarding.  Musculoskeletal: She exhibits no edema and no tenderness.  Neurological: She is alert. She has normal strength. No sensory deficit. Cranial nerve deficit:  no gross defecits noted. She exhibits normal muscle tone. She displays no seizure activity. Coordination normal.  Skin: Skin is warm and dry. Rash noted. No  purpura noted. Rash is not pustular and not vesicular.       Several areas, varying in size from <1cm to several cms of erythema, and induration, ttp, blanching, some lesions on soles  Psychiatric: She has a normal mood and affect.    ED Course  Procedures (including critical care time)  Labs Reviewed  CBC WITH DIFFERENTIAL - Abnormal; Notable for the following:    HCT 35.0 (*)     Eosinophils Relative 7 (*)     All other components within normal limits  COMPREHENSIVE METABOLIC PANEL - Abnormal; Notable for the following:    Sodium 134 (*)     Potassium 3.4 (*)     Total Bilirubin 0.2 (*)     All other components within normal limits  WET PREP, GENITAL - Abnormal; Notable for the following:    Clue Cells Wet Prep HPF POC MANY (*)     All other components within normal limits  URINALYSIS, ROUTINE W REFLEX MICROSCOPIC - Abnormal; Notable for the following:    Specific Gravity, Urine 1.039 (*)     Bilirubin Urine SMALL (*)     Ketones, ur 40 (*)     All other components within normal limits  MONONUCLEOSIS SCREEN  RAPID STREP SCREEN  PREGNANCY, URINE  GC/CHLAMYDIA PROBE AMP  RPR   No results found.   1. Rash   2. Erythema multiforme   3. Bacterial vaginosis       MDM  Rash Pt has persistent rash.  ?EM.  RPR sent off but overall doubt that is the etiology.  No sign of thrombocytopenia or other systemic complication.  Recc follow up with a PCP  Vaginal dc. Consistent with BV.  Will treat with flagyl        Kathalene Frames, MD 09/20/12 1300

## 2012-09-20 NOTE — ED Notes (Addendum)
Reported this pass Monday 25th seen @ [facility] ED for rash & constipation however no relief.  In addition stated had fever on Thurs, dizzy yesterday & this morning.

## 2012-09-20 NOTE — ED Notes (Addendum)
Pt went to [facility] for rash on Monday, given Pepcid and Benadryl.  Pt states she has been taking prescriptions as prescribed but rash continues to get worse. Pt noted to have red, raised bumps on face and upper body.  Denies SOB, CP, denies new medications, food, detergents, lotions, etc.  Pt states she also thinks she has a yeast infection and BV and would like to be treated.

## 2012-09-20 NOTE — ED Notes (Signed)
Pt aware of the need for a urine sample, unable to void at this time.  °

## 2012-09-21 LAB — GC/CHLAMYDIA PROBE AMP: CT Probe RNA: NEGATIVE

## 2012-10-28 ENCOUNTER — Emergency Department (HOSPITAL_COMMUNITY)
Admission: EM | Admit: 2012-10-28 | Discharge: 2012-10-28 | Disposition: A | Discharge status: Home / Self Care | Payer: Self-pay | Attending: Emergency Medicine | Admitting: Emergency Medicine

## 2012-10-28 ENCOUNTER — Encounter (HOSPITAL_COMMUNITY): Payer: Self-pay | Admitting: Emergency Medicine

## 2012-10-28 DIAGNOSIS — J45909 Unspecified asthma, uncomplicated: Secondary | ICD-10-CM | POA: Insufficient documentation

## 2012-10-28 DIAGNOSIS — Z8659 Personal history of other mental and behavioral disorders: Secondary | ICD-10-CM | POA: Insufficient documentation

## 2012-10-28 DIAGNOSIS — F17200 Nicotine dependence, unspecified, uncomplicated: Secondary | ICD-10-CM | POA: Insufficient documentation

## 2012-10-28 DIAGNOSIS — T7840XA Allergy, unspecified, initial encounter: Secondary | ICD-10-CM | POA: Insufficient documentation

## 2012-10-28 DIAGNOSIS — Z8619 Personal history of other infectious and parasitic diseases: Secondary | ICD-10-CM | POA: Insufficient documentation

## 2012-10-28 DIAGNOSIS — M2550 Pain in unspecified joint: Secondary | ICD-10-CM | POA: Insufficient documentation

## 2012-10-28 DIAGNOSIS — R209 Unspecified disturbances of skin sensation: Secondary | ICD-10-CM | POA: Insufficient documentation

## 2012-10-28 DIAGNOSIS — T4995XA Adverse effect of unspecified topical agent, initial encounter: Secondary | ICD-10-CM | POA: Insufficient documentation

## 2012-10-28 MED ORDER — HYDROXYZINE HCL 25 MG PO TABS
25.0000 mg | ORAL_TABLET | Freq: Once | ORAL | Status: AC
  Administered 2012-10-28: 25 mg via ORAL
  Filled 2012-10-28: qty 1

## 2012-10-28 MED ORDER — HYDROXYZINE HCL 25 MG PO TABS: 25.0000 mg | ORAL_TABLET | Freq: Four times a day (QID) | ORAL | Status: DC

## 2012-10-28 MED ORDER — HYDROCORTISONE 2.5 % EX LOTN: TOPICAL_LOTION | Freq: Two times a day (BID) | CUTANEOUS | Status: DC

## 2012-10-28 NOTE — ED Notes (Signed)
Pt alert, arrives from home, c/o pain in right foot, onset several days ago, denies trauma or injury, resp even unlabored, skin pwd, ambulates to triage

## 2012-10-28 NOTE — ED Provider Notes (Signed)
History  Scribed for Gabriela Razor, MD, the patient was seen in room WTR7/WTR7. This chart was scribed by Candelaria Stagers. The patient's care started at 9:50 PM    CSN: [id number]  Arrival date & time 10/28/12  4098   First MD Initiated Contact with Patient 10/28/12 2147      Chief Complaint  Patient presents with  . Foot Pain     The history is provided by the patient. No language interpreter was used.   [name] is a 27 y.o. female who presents to the Emergency Department complaining of sudden onset of numbness and pain to the right foot that started about five days ago.  She is also experiencing swelling and itching to the right foot, particularly over the medial malleolus.  She denies any injury or trauma.  Pt is concerned that she was bitten by a bug on the medial malleolus.  She has taken ibuprofen, benadryl, iced the area, and soaked the foot in epsom salt with no relief.  She states that the sx are getting worse.    Denies fever or chills.   Past Medical History  Diagnosis Date  . HPV in female   . Anxiety   . Asthma   . H/O exercise stress test 2009    nl echo also  [facility]  . H/O major depression 2008     poss bipolar  Dr Dub Mikes    Past Surgical History  Procedure Date  . Hernia repair   . Ovarian cyst removal   . Wisdom tooth extraction     No family history on file.  History  Substance Use Topics  . Smoking status: Current Every Day Smoker -- 0.2 packs/day  . Smokeless tobacco: Not on file  . Alcohol Use: No    OB History    Grav Para Term Preterm Abortions TAB SAB Ect Mult Living   1    1  1          Review of Systems  Constitutional: Negative for fever and chills.  HENT: Negative for trouble swallowing.   Respiratory: Negative for shortness of breath.   Gastrointestinal: Negative for nausea and vomiting.  Musculoskeletal: Positive for arthralgias (right foot pain).  Neurological: Positive for numbness (right foot). Negative for weakness.  All  other systems reviewed and are negative.    Allergies  Morphine and related; Orange fruit; Lamictal; and Latex  Home Medications   Current Outpatient Rx  Name  Route  Sig  Dispense  Refill  . IBUPROFEN 200 MG PO TABS   Oral   Take 200 mg by mouth every 6 (six) hours as needed. Pain           BP 130/81  Pulse 50  Temp 98.8 F (37.1 C) (Oral)  Resp 18  SpO2 100%  LMP 10/23/2012  Physical Exam  Nursing note and vitals reviewed. Constitutional: She is oriented to person, place, and time. She appears well-developed and well-nourished. No distress.  HENT:  Head: Normocephalic and atraumatic.  Mouth/Throat: Oropharynx is clear and moist. No oropharyngeal exudate.  Eyes: EOM are normal.  Neck: Neck supple. No tracheal deviation present.  Cardiovascular: Normal rate and regular rhythm.   Pulses:      Dorsalis pedis pulses are 2+ on the right side, and 2+ on the left side.  Pulmonary/Chest: Effort normal and breath sounds normal. No respiratory distress. She has no wheezes. She has no rales.  Musculoskeletal: Normal range of motion.  Right foot is not warm to touch.  Small erythematous area over the medial malleolus of the right foot.  No petechia, no purpura.    Neurological: She is alert and oriented to person, place, and time. No sensory deficit. Gait normal.  Skin: Skin is warm and dry.  Psychiatric: She has a normal mood and affect. Her behavior is normal.    ED Course  Procedures   DIAGNOSTIC STUDIES: Oxygen Saturation is 100% on room air, normal by my interpretation.    COORDINATION OF CARE:  9:57 PM Advised pt to continue to elevate and take ibuprofen and benadryl.  Advised pt to follow up with    Labs Reviewed - No data to display No results found.   No diagnosis found.    MDM  Patient appears to have a localized allergic reaction at the site of a possible bug bite.  No abscess.  No fever or chills.  Patient instructed to take Vistaril for itching  and to apply Hydrocortisone to the area.  Return precautions discussed. I personally performed the services described in this documentation, which was scribed in my presence. The recorded information has been reviewed and is accurate.         Pascal Lux [place], PA-C 10/29/12 1300

## 2012-11-02 NOTE — ED Provider Notes (Signed)
Medical screening examination/treatment/procedure(s) were performed by non-physician practitioner and as supervising physician I was immediately available for consultation/collaboration.  Quang Thorpe, MD 11/02/12 0040 

## 2012-12-31 ENCOUNTER — Other Ambulatory Visit (HOSPITAL_COMMUNITY)
Admission: RE | Admit: 2012-12-31 | Discharge: 2012-12-31 | Disposition: A | Discharge status: Home / Self Care | Payer: 59 | Source: Ambulatory Visit | Attending: Family | Admitting: Family

## 2012-12-31 ENCOUNTER — Ambulatory Visit (INDEPENDENT_AMBULATORY_CARE_PROVIDER_SITE_OTHER): Payer: BC Managed Care – PPO | Admitting: Family

## 2012-12-31 ENCOUNTER — Encounter: Payer: Self-pay | Admitting: Family

## 2012-12-31 VITALS — BP 120/76 | HR 78 | Wt 151.0 lb

## 2012-12-31 DIAGNOSIS — R200 Anesthesia of skin: Secondary | ICD-10-CM

## 2012-12-31 DIAGNOSIS — N6320 Unspecified lump in the left breast, unspecified quadrant: Secondary | ICD-10-CM

## 2012-12-31 DIAGNOSIS — Z1231 Encounter for screening mammogram for malignant neoplasm of breast: Secondary | ICD-10-CM

## 2012-12-31 DIAGNOSIS — Z113 Encounter for screening for infections with a predominantly sexual mode of transmission: Secondary | ICD-10-CM

## 2012-12-31 DIAGNOSIS — N63 Unspecified lump in unspecified breast: Secondary | ICD-10-CM

## 2012-12-31 DIAGNOSIS — N760 Acute vaginitis: Secondary | ICD-10-CM | POA: Insufficient documentation

## 2012-12-31 DIAGNOSIS — R209 Unspecified disturbances of skin sensation: Secondary | ICD-10-CM

## 2012-12-31 DIAGNOSIS — N898 Other specified noninflammatory disorders of vagina: Secondary | ICD-10-CM

## 2012-12-31 DIAGNOSIS — Z124 Encounter for screening for malignant neoplasm of cervix: Secondary | ICD-10-CM

## 2012-12-31 DIAGNOSIS — Z01419 Encounter for gynecological examination (general) (routine) without abnormal findings: Secondary | ICD-10-CM | POA: Insufficient documentation

## 2012-12-31 LAB — CBC WITH DIFFERENTIAL/PLATELET
Basophils Absolute: 0 10*3/uL (ref 0.0–0.1)
Eosinophils Relative: 3.4 % (ref 0.0–5.0)
HCT: 35.9 % — ABNORMAL LOW (ref 36.0–46.0)
Lymphocytes Relative: 34.1 % (ref 12.0–46.0)
Lymphs Abs: 1.5 10*3/uL (ref 0.7–4.0)
Monocytes Relative: 11.6 % (ref 3.0–12.0)
Platelets: 238 10*3/uL (ref 150.0–400.0)
WBC: 4.3 10*3/uL — ABNORMAL LOW (ref 4.5–10.5)

## 2012-12-31 LAB — COMPREHENSIVE METABOLIC PANEL
AST: 25 U/L (ref 0–37)
Albumin: 4.2 g/dL (ref 3.5–5.2)
Alkaline Phosphatase: 45 U/L (ref 39–117)
BUN: 13 mg/dL (ref 6–23)
Potassium: 3.6 mEq/L (ref 3.5–5.1)
Sodium: 140 mEq/L (ref 135–145)
Total Bilirubin: 0.5 mg/dL (ref 0.3–1.2)
Total Protein: 7.7 g/dL (ref 6.0–8.3)

## 2012-12-31 LAB — T3, FREE: T3, Free: 3.4 pg/mL (ref 2.3–4.2)

## 2012-12-31 MED ORDER — METRONIDAZOLE 0.75 % VA GEL: 1.0000 | Freq: Every day | VAGINAL | Status: AC

## 2012-12-31 NOTE — Patient Instructions (Signed)
Bacterial Vaginosis Bacterial vaginosis (BV) is a vaginal infection where the normal balance of bacteria in the vagina is disrupted. The normal balance is then replaced by an overgrowth of certain bacteria. There are several different kinds of bacteria that can cause BV. BV is the most common vaginal infection in women of childbearing age. CAUSES   The cause of BV is not fully understood. BV develops when there is an increase or imbalance of harmful bacteria.  Some activities or behaviors can upset the normal balance of bacteria in the vagina and put women at increased risk including:  Having a new sex partner or multiple sex partners.  Douching.  Using an intrauterine device (IUD) for contraception.  It is not clear what role sexual activity plays in the development of BV. However, women that have never had sexual intercourse are rarely infected with BV. Women do not get BV from toilet seats, bedding, swimming pools or from touching objects around them.  SYMPTOMS   Grey vaginal discharge.  A fish-like odor with discharge, especially after sexual intercourse.  Itching or burning of the vagina and vulva.  Burning or pain with urination.  Some women have no signs or symptoms at all. DIAGNOSIS  Your caregiver must examine the vagina for signs of BV. Your caregiver will perform lab tests and look at the sample of vaginal fluid through a microscope. They will look for bacteria and abnormal cells (clue cells), a pH test higher than 4.5, and a positive amine test all associated with BV.  RISKS AND COMPLICATIONS   Pelvic inflammatory disease (PID).  Infections following gynecology surgery.  Developing HIV.  Developing herpes virus. TREATMENT  Sometimes BV will clear up without treatment. However, all women with symptoms of BV should be treated to avoid complications, especially if gynecology surgery is planned. Female partners generally do not need to be treated. However, BV may spread  between female sex partners so treatment is helpful in preventing a recurrence of BV.   BV may be treated with antibiotics. The antibiotics come in either pill or vaginal cream forms. Either can be used with nonpregnant or pregnant women, but the recommended dosages differ. These antibiotics are not harmful to the baby.  BV can recur after treatment. If this happens, a second round of antibiotics will often be prescribed.  Treatment is important for pregnant women. If not treated, BV can cause a premature delivery, especially for a pregnant woman who had a premature birth in the past. All pregnant women who have symptoms of BV should be checked and treated.  For chronic reoccurrence of BV, treatment with a type of prescribed gel vaginally twice a week is helpful. HOME CARE INSTRUCTIONS   Finish all medication as directed by your caregiver.  Do not have sex until treatment is completed.  Tell your sexual partner that you have a vaginal infection. They should see their caregiver and be treated if they have problems, such as a mild rash or itching.  Practice safe sex. Use condoms. Only have 1 sex partner. PREVENTION  Basic prevention steps can help reduce the risk of upsetting the natural balance of bacteria in the vagina and developing BV:  Do not have sexual intercourse (be abstinent).  Do not douche.  Use all of the medicine prescribed for treatment of BV, even if the signs and symptoms go away.  Tell your sex partner if you have BV. That way, they can be treated, if needed, to prevent reoccurrence. SEEK MEDICAL CARE IF:     Your symptoms are not improving after 3 days of treatment.  You have increased discharge, pain, or fever. MAKE SURE YOU:   Understand these instructions.  Will watch your condition.  Will get help right away if you are not doing well or get worse. FOR MORE INFORMATION  Division of STD Prevention (DSTDP), Centers for Disease Control and Prevention:  www.cdc.gov/std American Social Health Association (ASHA): www.ashastd.org  Document Released: 10/08/2005 Document Revised: 12/31/2011 Document Reviewed: 03/31/2009 Exit[employer] Patient Information 2013 [employer].  
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
## 2013-01-01 ENCOUNTER — Ambulatory Visit: Payer: BC Managed Care – PPO

## 2013-01-01 LAB — HSV 2 ANTIBODY, IGG: HSV 2 Glycoprotein G Ab, IgG: 0.15 IV

## 2013-01-01 LAB — RPR

## 2013-01-02 ENCOUNTER — Other Ambulatory Visit: Payer: Self-pay | Admitting: Family

## 2013-01-02 ENCOUNTER — Ambulatory Visit
Admission: RE | Admit: 2013-01-02 | Discharge: 2013-01-02 | Disposition: A | Discharge status: Home / Self Care | Payer: BC Managed Care – PPO | Source: Ambulatory Visit | Attending: Family | Admitting: Family

## 2013-01-02 DIAGNOSIS — N63 Unspecified lump in unspecified breast: Secondary | ICD-10-CM

## 2013-01-02 DIAGNOSIS — Z1231 Encounter for screening mammogram for malignant neoplasm of breast: Secondary | ICD-10-CM

## 2013-01-06 ENCOUNTER — Encounter (HOSPITAL_COMMUNITY): Payer: Self-pay | Admitting: General Practice

## 2013-01-06 ENCOUNTER — Emergency Department (HOSPITAL_COMMUNITY)
Admission: EM | Admit: 2013-01-06 | Discharge: 2013-01-06 | Disposition: A | Discharge status: Home / Self Care | Payer: 59 | Attending: Emergency Medicine | Admitting: Emergency Medicine

## 2013-01-06 DIAGNOSIS — K921 Melena: Secondary | ICD-10-CM | POA: Insufficient documentation

## 2013-01-06 DIAGNOSIS — R197 Diarrhea, unspecified: Secondary | ICD-10-CM | POA: Insufficient documentation

## 2013-01-06 DIAGNOSIS — H538 Other visual disturbances: Secondary | ICD-10-CM | POA: Insufficient documentation

## 2013-01-06 DIAGNOSIS — H5710 Ocular pain, unspecified eye: Secondary | ICD-10-CM | POA: Insufficient documentation

## 2013-01-06 DIAGNOSIS — L299 Pruritus, unspecified: Secondary | ICD-10-CM | POA: Insufficient documentation

## 2013-01-06 DIAGNOSIS — F17200 Nicotine dependence, unspecified, uncomplicated: Secondary | ICD-10-CM | POA: Insufficient documentation

## 2013-01-06 DIAGNOSIS — Z8659 Personal history of other mental and behavioral disorders: Secondary | ICD-10-CM | POA: Insufficient documentation

## 2013-01-06 DIAGNOSIS — R21 Rash and other nonspecific skin eruption: Secondary | ICD-10-CM

## 2013-01-06 DIAGNOSIS — Z8619 Personal history of other infectious and parasitic diseases: Secondary | ICD-10-CM | POA: Insufficient documentation

## 2013-01-06 DIAGNOSIS — F411 Generalized anxiety disorder: Secondary | ICD-10-CM | POA: Insufficient documentation

## 2013-01-06 DIAGNOSIS — J45909 Unspecified asthma, uncomplicated: Secondary | ICD-10-CM | POA: Insufficient documentation

## 2013-01-06 DIAGNOSIS — Z79899 Other long term (current) drug therapy: Secondary | ICD-10-CM | POA: Insufficient documentation

## 2013-01-06 DIAGNOSIS — H5789 Other specified disorders of eye and adnexa: Secondary | ICD-10-CM | POA: Insufficient documentation

## 2013-01-06 DIAGNOSIS — B86 Scabies: Secondary | ICD-10-CM

## 2013-01-06 DIAGNOSIS — L509 Urticaria, unspecified: Secondary | ICD-10-CM | POA: Insufficient documentation

## 2013-01-06 LAB — OCCULT BLOOD, POC DEVICE: Fecal Occult Bld: NEGATIVE

## 2013-01-06 MED ORDER — PREDNISONE 20 MG PO TABS: ORAL_TABLET | ORAL | Status: DC

## 2013-01-06 MED ORDER — PERMETHRIN 5 % EX CREA: TOPICAL_CREAM | CUTANEOUS | Status: AC

## 2013-01-06 MED ORDER — FAMOTIDINE 20 MG PO TABS
20.0000 mg | ORAL_TABLET | Freq: Once | ORAL | Status: AC
  Administered 2013-01-06: 20 mg via ORAL
  Filled 2013-01-06: qty 1

## 2013-01-06 MED ORDER — EPINEPHRINE HCL 0.1 MG/ML IJ SOLN
0.3000 mg | Freq: Once | INTRAMUSCULAR | Status: DC
  Filled 2013-01-06: qty 10

## 2013-01-06 MED ORDER — DIPHENHYDRAMINE HCL 25 MG PO CAPS
25.0000 mg | ORAL_CAPSULE | Freq: Once | ORAL | Status: AC
  Administered 2013-01-06: 25 mg via ORAL
  Filled 2013-01-06: qty 1

## 2013-01-06 MED ORDER — EPINEPHRINE 0.3 MG/0.3ML IJ DEVI
INTRAMUSCULAR | Status: AC
  Administered 2013-01-06: 0.3 mg via INTRAMUSCULAR
  Filled 2013-01-06: qty 0.3

## 2013-01-06 MED ORDER — DIPHENHYDRAMINE HCL 25 MG PO TABS: 25.0000 mg | ORAL_TABLET | Freq: Four times a day (QID) | ORAL | Status: AC

## 2013-01-06 MED ORDER — TETRACAINE HCL 0.5 % OP SOLN
2.0000 [drp] | Freq: Once | OPHTHALMIC | Status: AC
  Administered 2013-01-06: 2 [drp] via OPHTHALMIC
  Filled 2013-01-06: qty 2

## 2013-01-06 MED ORDER — EPINEPHRINE 0.3 MG/0.3ML IJ DEVI
0.3000 mg | Freq: Once | INTRAMUSCULAR | Status: AC
  Administered 2013-01-06: 0.3 mg via INTRAMUSCULAR

## 2013-01-06 MED ORDER — FLUORESCEIN SODIUM 1 MG OP STRP
1.0000 | ORAL_STRIP | Freq: Once | OPHTHALMIC | Status: AC
Start: 2013-01-06 — End: 2013-01-06
  Administered 2013-01-06: 1 via OPHTHALMIC
  Filled 2013-01-06: qty 1

## 2013-01-06 MED ORDER — PREDNISONE 20 MG PO TABS
60.0000 mg | ORAL_TABLET | Freq: Once | ORAL | Status: AC
Start: 2013-01-06 — End: 2013-01-06
  Administered 2013-01-06: 60 mg via ORAL
  Filled 2013-01-06: qty 3

## 2013-01-06 NOTE — ED Provider Notes (Signed)
Medical screening examination/treatment/procedure(s) were conducted as a shared visit with non-physician practitioner(s) and myself.  I personally evaluated the patient during the encounter Medical screening examination/treatment/procedure(s) were conducted as a shared visit with non-physician practitioner(s) and myself. I personally evaluated the patient during the encounter  27 yo woman with new onset of urticarial rash. Rx with epinephrine, Benadryl, Pepcid and prednisone.    Carleene Cooper III, MD 01/06/13 2013

## 2013-01-06 NOTE — ED Notes (Signed)
Per EMS swelling to right eye, some clear discharge. Rash on left arm and small of back. Rash on and off for past 6 mo. Start large then progress to hard painful areas. No acute distress with breathing/swallowing.  BP 126/80, pulse 110, rr 16, pain as a 6 in eye.

## 2013-01-06 NOTE — ED Provider Notes (Signed)
Medical screening examination/treatment/procedure(s) were conducted as a shared visit with non-physician practitioner(s) and myself.  I personally evaluated the patient during the encounter 27 yo woman with new onset of urticarial rash. Rx with epinephrine, Benadryl, Pepcid and prednisone.    Carleene Cooper III, MD 01/06/13 [phone]

## 2013-01-06 NOTE — Progress Notes (Signed)
27 yo woman with new onset of urticarial rash.  Rx with epinephrine, Benadryl, Pepcid and prednisone.

## 2013-01-06 NOTE — ED Provider Notes (Addendum)
History     CSN: [id number]  Arrival date & time 01/06/13  7829   First MD Initiated Contact with Patient 01/06/13 [phone]      Chief Complaint  Patient presents with  . Rash    (Consider location/radiation/quality/duration/timing/severity/associated sxs/prior treatment) HPI Comments: 27 year old female presents emergency department via EMS complaining of a rash that she noticed when she woke up this morning. States she felt her eye was swollen, shortness of air noticed a rash around her right eye with associated clear discharge. Rash also present on bilateral arms, her back and right leg. Admits to associated slight blurred vision and pain in her right eye. Admits to having one episode of diarrhea with a small amount of bright red blood when she woke up this morning. Denies abdominal or rectal pain. She has had a rash on and off for the past 6 months, however this is different. The rash is itchy. Denies any new soaps, detergents, pets, foods or medication. No recent travel. No contacts with similar rash. Denies difficulty breathing or swallowing.  The history is provided by the patient.    Past Medical History  Diagnosis Date  . HPV in female   . Anxiety   . Asthma   . H/O exercise stress test 2009    nl echo also  [facility]  . H/O major depression 2008     poss bipolar  Dr Dub Mikes    Past Surgical History  Procedure Laterality Date  . Hernia repair    . Ovarian cyst removal    . Wisdom tooth extraction      Family History  Problem Relation Age of Onset  . Cancer Mother     History  Substance Use Topics  . Smoking status: Current Every Day Smoker -- 0.25 packs/day  . Smokeless tobacco: Not on file  . Alcohol Use: No    OB History   Grav Para Term Preterm Abortions TAB SAB Ect Mult Living   1    1  1          Review of Systems  Constitutional: Negative for fever and chills.  HENT: Negative for mouth sores, trouble swallowing, neck pain and neck stiffness.   Eyes:  Positive for pain and visual disturbance (blurred vision). Negative for photophobia, redness and itching.  Respiratory: Negative for chest tightness, shortness of breath and wheezing.   Cardiovascular: Negative for chest pain.  Gastrointestinal: Positive for diarrhea and blood in stool. Negative for rectal pain.  Musculoskeletal: Negative for myalgias and arthralgias.  Skin: Positive for rash.  Neurological: Negative for weakness.  All other systems reviewed and are negative.    Allergies  Morphine and related; Orange fruit; Lamictal; and Latex  Home Medications   Current Outpatient Rx  Name  Route  Sig  Dispense  Refill  . hydrocortisone 2.5 % lotion   Topical   Apply topically 2 (two) times daily.   59 mL   0   . hydrOXYzine (ATARAX/VISTARIL) 25 MG tablet   Oral   Take 1 tablet (25 mg total) by mouth every 6 (six) hours.   12 tablet   0   . ibuprofen (ADVIL,MOTRIN) 200 MG tablet   Oral   Take 200 mg by mouth every 6 (six) hours as needed. Pain           BP 126/86  Pulse 70  Temp(Src) 98.4 F (36.9 C)  Resp 16  SpO2 100%  Physical Exam  Nursing note and vitals reviewed. Constitutional: She  appears well-developed and well-nourished. No distress.  HENT:  Head: Normocephalic and atraumatic.  Mouth/Throat: Uvula is midline, oropharynx is clear and moist and mucous membranes are normal. No oral lesions.  Eyes: Conjunctivae and EOM are normal. Pupils are equal, round, and reactive to light.  Slit lamp exam:      The right eye shows no corneal abrasion and no foreign body.  Neck: Normal range of motion. Neck supple. No tracheal deviation present.  Cardiovascular: Normal rate, regular rhythm, normal heart sounds and intact distal pulses.   Pulmonary/Chest: Effort normal and breath sounds normal. No respiratory distress. She has no wheezes.  Abdominal: Soft. Bowel sounds are normal. There is no tenderness.  Genitourinary: Rectal exam shows external hemorrhoid (no  bleeding or thrombosis) and tenderness. Rectal exam shows no internal hemorrhoid, no fissure and no mass.  No gross blood on exam glove  Musculoskeletal: Normal range of motion. She exhibits no edema.  Lymphadenopathy:    She has no cervical adenopathy.  Skin: Skin is warm and dry. Rash noted. Rash is urticarial (scattered on back, bilateral arms, right leg, right side of face around eye). She is not diaphoretic.  Psychiatric: She has a normal mood and affect. Her behavior is normal.    ED Course  Procedures (including critical care time)  Labs Reviewed - No data to display No results found.   1. Urticaria   2. Rash   3. Scabies       MDM  27 year old female with urticarial rash. Eye was stained with fluorescein, no foreign body or corneal abrasion. Occult blood negative regarding her bloody stools this morning. Patient received epinephrine, Benadryl, prednisone and Pepcid and states she feels a lot better. Rash is beginning to subside. Visual disturbance is completely gone. She is no longer complaining of eye pain. She is stable for discharge with prednisone and Benadryl. She will followup with her PCP at the end of the week. No respiratory or airway compromise. Close return precautions discussed. Patient states understanding of plan and is agreeable. Case discussed with Dr. Ignacia Palma who also evaluated patient and agrees with plan of care.  9:37 AM Patient on discharge states she noticed a bed bug. Will treat with permethrin.     Trevor Mace, PA-C 01/06/13 0933  Trevor Mace, PA-C 01/06/13 [phone]

## 2013-01-08 ENCOUNTER — Telehealth: Payer: Self-pay | Admitting: Family

## 2013-01-08 NOTE — Telephone Encounter (Signed)
Pt will callback tomorrow requesting blood work results. Pt does not have a phone

## 2013-01-09 NOTE — Telephone Encounter (Signed)
Noted  

## 2013-01-16 ENCOUNTER — Telehealth: Payer: Self-pay | Admitting: Family

## 2013-01-16 MED ORDER — FLUCONAZOLE 150 MG PO TABS: 150.0000 mg | ORAL_TABLET | Freq: Once | ORAL | Status: DC

## 2013-01-16 NOTE — Telephone Encounter (Signed)
Pt was RX'd  vaginal cream on the 3/12 and that resulted in yeast infection.  Pt states she talked w/ the nurse yesterday. Nurse supposed to call her in an antibiotic for this yeast infection. Pt very uncomfortable.  Would like something ASAP.  [facility] [address]

## 2013-01-16 NOTE — Telephone Encounter (Signed)
Rx sent 

## 2013-02-09 ENCOUNTER — Other Ambulatory Visit: Payer: BC Managed Care – PPO

## 2013-03-04 ENCOUNTER — Other Ambulatory Visit: Payer: BC Managed Care – PPO

## 2013-03-27 ENCOUNTER — Telehealth: Payer: Self-pay | Admitting: Family

## 2013-03-27 MED ORDER — METRONIDAZOLE 0.75 % VA GEL: 1.0000 | Freq: Every day | VAGINAL | Status: AC

## 2013-03-27 NOTE — Telephone Encounter (Signed)
Pt has yeast infection and would like refill of: metroNIDAZOLE (METROGEL) 0.75 % vaginal gel fluconazole (DIFLUCAN) 150 MG tablet Pharm:  [facility] [address]

## 2013-03-27 NOTE — Telephone Encounter (Signed)
Ok to fill 

## 2014-08-23 ENCOUNTER — Encounter (HOSPITAL_COMMUNITY): Payer: Self-pay | Admitting: General Practice

## 2019-05-05 ENCOUNTER — Encounter: Payer: Self-pay | Admitting: Emergency Medicine

## 2019-05-05 ENCOUNTER — Other Ambulatory Visit: Payer: Self-pay

## 2019-05-05 ENCOUNTER — Ambulatory Visit
Admission: EM | Admit: 2019-05-05 | Discharge: 2019-05-05 | Disposition: A | Discharge status: Home / Self Care | Payer: Medicaid Other | Attending: Family Medicine | Admitting: Family Medicine

## 2019-05-05 DIAGNOSIS — R42 Dizziness and giddiness: Secondary | ICD-10-CM | POA: Diagnosis not present

## 2019-05-05 DIAGNOSIS — R079 Chest pain, unspecified: Secondary | ICD-10-CM | POA: Diagnosis not present

## 2019-05-05 HISTORY — DX: Fibromyalgia: M79.7

## 2019-05-05 MED ORDER — MECLIZINE HCL 25 MG PO TABS: 25.0000 mg | ORAL_TABLET | Freq: Three times a day (TID) | ORAL | 0 refills | Status: AC | PRN
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
## 2019-05-05 NOTE — ED Triage Notes (Signed)
Pt presents to [facility] for assessment of hypertension (415A systolic at home) with dizziness, fatigue, and an episode of chest pain that lasted 2-3 minutes 3 days ago with pressure in her chest.  Patient c/o intermittent diaphoresis and hand numbness.  Denies SOB.

## 2019-05-05 NOTE — ED Notes (Signed)
Patient able to ambulate independently  

## 2019-05-12 ENCOUNTER — Ambulatory Visit
Admission: EM | Admit: 2019-05-12 | Discharge: 2019-05-12 | Disposition: A | Discharge status: Home / Self Care | Payer: Medicaid Other | Attending: Physician Assistant | Admitting: Physician Assistant

## 2019-05-12 ENCOUNTER — Other Ambulatory Visit: Payer: Self-pay

## 2019-05-12 DIAGNOSIS — R05 Cough: Secondary | ICD-10-CM

## 2019-05-12 DIAGNOSIS — R509 Fever, unspecified: Secondary | ICD-10-CM | POA: Diagnosis not present

## 2019-05-12 DIAGNOSIS — Z20828 Contact with and (suspected) exposure to other viral communicable diseases: Secondary | ICD-10-CM | POA: Diagnosis not present

## 2019-05-12 DIAGNOSIS — R5383 Other fatigue: Secondary | ICD-10-CM | POA: Diagnosis not present

## 2019-05-12 DIAGNOSIS — Z20822 Contact with and (suspected) exposure to covid-19: Secondary | ICD-10-CM

## 2019-05-12 MED ORDER — ALBUTEROL SULFATE (2.5 MG/3ML) 0.083% IN NEBU: 2.5000 mg | INHALATION_SOLUTION | Freq: Four times a day (QID) | RESPIRATORY_TRACT | 0 refills | Status: DC | PRN

## 2019-05-12 MED ORDER — ALBUTEROL SULFATE HFA 108 (90 BASE) MCG/ACT IN AERS: 1.0000 | INHALATION_SPRAY | Freq: Four times a day (QID) | RESPIRATORY_TRACT | 0 refills | Status: DC | PRN

## 2019-05-12 MED ORDER — DOXYCYCLINE HYCLATE 100 MG PO CAPS: 100.0000 mg | ORAL_CAPSULE | Freq: Two times a day (BID) | ORAL | 0 refills | Status: DC

## 2019-05-12 NOTE — ED Triage Notes (Signed)
Pt c/o fever, cough, loss of taste, and weakness x4 days

## 2019-05-12 NOTE — ED Provider Notes (Signed)
[facility]    CSN: [id number] Arrival date & time: 05/12/19  1511     History   Chief Complaint Chief Complaint  Patient presents with  . Cough    HPI [name] is a 33 y.o. female.   33 year old female comes in for 4-day history of COVID-like symptoms.  Has had fever, cough, loss of taste and smell, generalized fatigue/weakness.  States shortness of breath with wheezing that is somewhat improved with albuterol nebulizer.  She denies abdominal pain, nausea, vomiting.  She is unsure of any exposures.  Former smoker.     Past Medical History:  Diagnosis Date  . Anxiety   . Asthma   . Fibromyalgia   . H/O exercise stress test 2009   nl echo also  [facility]  . H/O major depression 2008    poss bipolar  Dr Sabra Heck  . HPV in female     Patient Active Problem List   Diagnosis Date Noted  . H/O exercise stress test     Past Surgical History:  Procedure Laterality Date  . HERNIA REPAIR    . OVARIAN CYST REMOVAL    . WISDOM TOOTH EXTRACTION      OB History    Gravida  1   Para      Term      Preterm      AB  1   Living        SAB  1   TAB      Ectopic      Multiple      Live Births               Home Medications    Prior to Admission medications   Medication Sig Start Date End Date Taking? Authorizing Provider  acetaminophen (TYLENOL) 500 MG tablet Take 500 mg by mouth every 6 (six) hours as needed for pain.    [provider]  albuterol (PROVENTIL) (2.5 MG/3ML) 0.083% nebulizer solution Take 3 mLs (2.5 mg total) by nebulization every 6 (six) hours as needed for wheezing or shortness of breath. 05/12/19   Tasia Catchings, Yoandri Congrove V, PA-C  albuterol (VENTOLIN HFA) 108 (90 Base) MCG/ACT inhaler Inhale 1-2 puffs into the lungs every 6 (six) hours as needed for wheezing or shortness of breath. 05/12/19   Tasia Catchings, Gray Doering V, PA-C  diphenhydrAMINE (BENADRYL) 25 MG tablet Take 25 mg by mouth every 6 (six) hours as needed for itching.    [provider]  diphenhydrAMINE (BENADRYL) 25 MG tablet Take 1 tablet (25 mg total) by mouth every 6 (six) hours. 01/06/13   Hess, Hessie Diener, PA-C  doxycycline (VIBRAMYCIN) 100 MG capsule Take 1 capsule (100 mg total) by mouth 2 (two) times daily. 05/12/19   Ok Edwards, PA-C  meclizine (ANTIVERT) 25 MG tablet Take 1 tablet (25 mg total) by mouth 3 (three) times daily as needed for dizziness. 05/05/19   Vanessa Kick, MD  OVER THE COUNTER MEDICATION Apply 1 application topically 2 (two) times daily as needed. For rash. Hydrocortisone cream    [provider]  permethrin (ELIMITE) 5 % cream Apply to affected area once 01/06/13   Hess, Hessie Diener, PA-C    Family History Family History  Problem Relation Age of Onset  . Cancer Mother     Social History Social History   Tobacco Use  . Smoking status: Former Smoker    Packs/day: 0.25  . Smokeless tobacco: Never Used  Substance Use Topics  .  Alcohol use: No  . Drug use: No     Allergies   Cogentin [benztropine], Latuda [lurasidone hcl], Morphine and related, Orange fruit [citrus], Lamictal [lamotrigine], and Latex   Review of Systems Review of Systems  Reason unable to perform ROS: See HPI as above.     Physical Exam Triage Vital Signs ED Triage Vitals  Enc Vitals Group     BP 05/12/19 1529 129/86     Pulse Rate 05/12/19 1529 99     Resp 05/12/19 1529 20     Temp 05/12/19 1529 99.1 F (37.3 C)     Temp src --      SpO2 05/12/19 1529 98 %     Weight --      Height --      Head Circumference --      Peak Flow --      Pain Score 05/12/19 1530 0     Pain Loc --      Pain Edu? --      Excl. in GC? --    No data found.  Updated Vital Signs BP 129/86 (BP Location: Left Arm)   Pulse 99   Temp 99.1 F (37.3 C)   Resp 20   LMP 04/29/2019   SpO2 98%   Visual Acuity Right Eye Distance:   Left Eye Distance:   Bilateral Distance:    Right Eye Near:   Left Eye Near:    Bilateral Near:     Physical Exam  Constitutional:      General: She is not in acute distress.    Appearance: Normal appearance. She is not ill-appearing, toxic-appearing or diaphoretic.  HENT:     Head: Normocephalic and atraumatic.     Mouth/Throat:     Mouth: Mucous membranes are moist.     Pharynx: Oropharynx is clear. Uvula midline.  Neck:     Musculoskeletal: Normal range of motion and neck supple.  Cardiovascular:     Rate and Rhythm: Normal rate and regular rhythm.     Heart sounds: Normal heart sounds. No murmur. No friction rub. No gallop.   Pulmonary:     Effort: Pulmonary effort is normal. No accessory muscle usage, prolonged expiration, respiratory distress or retractions.     Comments: Speaking in full sentences without difficulty. Lungs clear to auscultation without adventitious lung sounds. Neurological:     General: No focal deficit present.     Mental Status: She is alert and oriented to person, place, and time.      UC Treatments / Results  Labs (all labs ordered are listed, but only abnormal results are displayed) Labs Reviewed - No data to display  EKG   Radiology No results found.  Procedures Procedures (including critical care time)  Medications Ordered in UC Medications - No data to display  Initial Impression / Assessment and Plan / UC Course  I have reviewed the triage vital signs and the nursing notes.  Pertinent labs & imaging results that were available during my care of the patient were reviewed by me and considered in my medical decision making (see chart for details).    Patient speaking in full sentences without respiratory distress. Afebrile without antipyretic in last 8 hours. No tachycardia, tachypnea. Lungs CTAB. COVID testing ordered. Will start doxycycline to cover for bacterial infection. Given history and exam, will have patient self quarantine until results. Instructions on when to end quarantine, family member isolation discussed, and resources provided.  Symptomatic treatment discussed. Return precautions  given. Patient expresses understanding and agrees to plan.  Final Clinical Impressions(s) / UC Diagnoses   Final diagnoses:  Suspected Covid-19 Virus Infection    ED Prescriptions    Medication Sig Dispense Auth. Provider   doxycycline (VIBRAMYCIN) 100 MG capsule Take 1 capsule (100 mg total) by mouth 2 (two) times daily. 20 capsule Marlies Ligman V, PA-C   albuterol (VENTOLIN HFA) 108 (90 Base) MCG/ACT inhaler Inhale 1-2 puffs into the lungs every 6 (six) hours as needed for wheezing or shortness of breath. 18 g Latavius Capizzi V, PA-C   albuterol (PROVENTIL) (2.5 MG/3ML) 0.083% nebulizer solution Take 3 mLs (2.5 mg total) by nebulization every 6 (six) hours as needed for wheezing or shortness of breath. 75 mL Threasa AlphaYu, Arren Laminack V, PA-C        Regis Hinton V, [place]C 05/12/19 1813

## 2019-05-12 NOTE — Discharge Instructions (Signed)
As discussed, cannot rule out COVID. Currently, no alarming signs. Testing ordered. Start doxycycline to cover for bacterial infection. Albuterol as needed. I would like you to quarantine until testing results. If experiencing shortness of breath, trouble breathing, call 911 and provide them with your current situation.

## 2019-05-14 LAB — NOVEL CORONAVIRUS, NAA: SARS-CoV-2, NAA: NOT DETECTED

## 2019-05-18 ENCOUNTER — Telehealth: Payer: Self-pay | Admitting: Emergency Medicine

## 2019-05-18 NOTE — Telephone Encounter (Signed)
Patient called to get COVID results.  States she is still congested, and struggling with returning to work due to her fatigue.  This RN identified patient using two identifiers, and then provided negative results.  Make recommendations for return precautions, as well as reasons to go to the ER.  Patient verbalized understanding.

## 2019-08-19 ENCOUNTER — Encounter (HOSPITAL_COMMUNITY): Payer: Self-pay | Admitting: Emergency Medicine

## 2019-08-19 ENCOUNTER — Emergency Department (HOSPITAL_COMMUNITY)
Admission: EM | Admit: 2019-08-19 | Discharge: 2019-08-19 | Discharge status: Against Medical Advice | Payer: Medicaid Other | Attending: Emergency Medicine | Admitting: Emergency Medicine

## 2019-08-19 ENCOUNTER — Other Ambulatory Visit: Payer: Self-pay

## 2019-08-19 ENCOUNTER — Encounter: Payer: Self-pay | Admitting: Emergency Medicine

## 2019-08-19 ENCOUNTER — Emergency Department (HOSPITAL_COMMUNITY): Payer: Medicaid Other

## 2019-08-19 ENCOUNTER — Ambulatory Visit
Admission: EM | Admit: 2019-08-19 | Discharge: 2019-08-19 | Disposition: A | Discharge status: Home / Self Care | Payer: Medicaid Other | Attending: Emergency Medicine | Admitting: Emergency Medicine

## 2019-08-19 DIAGNOSIS — Z5321 Procedure and treatment not carried out due to patient leaving prior to being seen by health care provider: Secondary | ICD-10-CM | POA: Diagnosis not present

## 2019-08-19 DIAGNOSIS — R0789 Other chest pain: Secondary | ICD-10-CM | POA: Diagnosis present

## 2019-08-19 DIAGNOSIS — R079 Chest pain, unspecified: Secondary | ICD-10-CM | POA: Diagnosis not present

## 2019-08-19 LAB — CBC
HCT: 38.4 % (ref 36.0–46.0)
Hemoglobin: 12.7 g/dL (ref 12.0–15.0)
MCH: 30 pg (ref 26.0–34.0)
MCHC: 33.1 g/dL (ref 30.0–36.0)
MCV: 90.8 fL (ref 80.0–100.0)
Platelets: 325 10*3/uL (ref 150–400)
RBC: 4.23 MIL/uL (ref 3.87–5.11)
RDW: 14.9 % (ref 11.5–15.5)
WBC: 7 10*3/uL (ref 4.0–10.5)
nRBC: 0 % (ref 0.0–0.2)

## 2019-08-19 LAB — TROPONIN I (HIGH SENSITIVITY): Troponin I (High Sensitivity): 3 ng/L (ref <18)

## 2019-08-19 LAB — BASIC METABOLIC PANEL
Anion gap: 7 (ref 5–15)
BUN: 9 mg/dL (ref 6–20)
CO2: 22 mmol/L (ref 22–32)
Calcium: 8.9 mg/dL (ref 8.9–10.3)
Chloride: 109 mmol/L (ref 98–111)
Creatinine, Ser: 0.88 mg/dL (ref 0.44–1.00)
GFR calc Af Amer: >60 mL/min (ref >60)
GFR calc non Af Amer: >60 mL/min (ref >60)
Glucose, Bld: 94 mg/dL (ref 70–99)
Potassium: 3.7 mmol/L (ref 3.5–5.1)
Sodium: 138 mmol/L (ref 135–145)

## 2019-08-19 LAB — I-STAT BETA HCG BLOOD, ED (MC, WL, AP ONLY): I-stat hCG, quantitative: <5 m[IU]/mL (ref <5)

## 2019-08-19 MED ORDER — SODIUM CHLORIDE 0.9% FLUSH: 3.0000 mL | Freq: Once | INTRAVENOUS | Status: DC

## 2019-08-19 NOTE — ED Triage Notes (Signed)
Pt arrives via ems for Cp from [facility] , pt aaox4 states  Pressure in chest, c/o dizziness and nausea started this am 8 am

## 2019-08-19 NOTE — ED Notes (Signed)
Patient able to ambulate independently  

## 2019-08-19 NOTE — ED Notes (Addendum)
Due to patient's condition upon check-in, 911 called for emergency transport.  EKG taken.  EMS in room at this time assessing patient.  Pt denies hx of anxiety.

## 2019-08-19 NOTE — ED Provider Notes (Signed)
[facility]    CSN: [id number] Arrival date & time: 08/19/19  1028      History   Chief Complaint Chief Complaint  Patient presents with  . Chest Pain    HPI [name] is a 33 y.o. female with history of asthma, anxiety, major depression with possible BPD presenting for sudden onset of constant, nonradiating chest pain with associated dizziness, nausea since 8 AM this morning.  Patient states this woke her from her sleep.  Denies previous history of this, history of panic tach.  Patient denies recent change in medication.  No personal cardiac history, though patient does report cousin of hers passing recently from heart attack.   Past Medical History:  Diagnosis Date  . Anxiety   . Asthma   . Fibromyalgia   . H/O exercise stress test 2009   nl echo also  [facility]  . H/O major depression 2008    poss bipolar  Dr Dub MikesLugo  . HPV in female     Patient Active Problem List   Diagnosis Date Noted  . H/O exercise stress test     Past Surgical History:  Procedure Laterality Date  . HERNIA REPAIR    . OVARIAN CYST REMOVAL    . WISDOM TOOTH EXTRACTION      OB History    Gravida  1   Para      Term      Preterm      AB  1   Living        SAB  1   TAB      Ectopic      Multiple      Live Births               Home Medications    Prior to Admission medications   Medication Sig Start Date End Date Taking? Authorizing Provider  acetaminophen (TYLENOL) 500 MG tablet Take 500 mg by mouth every 6 (six) hours as needed for pain.    [provider]  albuterol (PROVENTIL) (2.5 MG/3ML) 0.083% nebulizer solution Take 3 mLs (2.5 mg total) by nebulization every 6 (six) hours as needed for wheezing or shortness of breath. 05/12/19   Cathie HoopsYu, Amy V, PA-C  albuterol (VENTOLIN HFA) 108 (90 Base) MCG/ACT inhaler Inhale 1-2 puffs into the lungs every 6 (six) hours as needed for wheezing or shortness of breath. 05/12/19   Cathie HoopsYu, Amy V, PA-C  diphenhydrAMINE  (BENADRYL) 25 MG tablet Take 25 mg by mouth every 6 (six) hours as needed for itching.    [provider]  diphenhydrAMINE (BENADRYL) 25 MG tablet Take 1 tablet (25 mg total) by mouth every 6 (six) hours. 01/06/13   Hess, Nada Boozerobyn M, PA-C  doxycycline (VIBRAMYCIN) 100 MG capsule Take 1 capsule (100 mg total) by mouth 2 (two) times daily. 05/12/19   Belinda FisherYu, Amy V, PA-C  meclizine (ANTIVERT) 25 MG tablet Take 1 tablet (25 mg total) by mouth 3 (three) times daily as needed for dizziness. 05/05/19   Mardella LaymanHagler, Brian, MD  OVER THE COUNTER MEDICATION Apply 1 application topically 2 (two) times daily as needed. For rash. Hydrocortisone cream    [provider]  permethrin (ELIMITE) 5 % cream Apply to affected area once 01/06/13   Hess, Nada Boozerobyn M, PA-C    Family History Family History  Problem Relation Age of Onset  . Cancer Mother     Social History Social History   Tobacco Use  . Smoking status: Former Smoker  Packs/day: 0.25  . Smokeless tobacco: Never Used  Substance Use Topics  . Alcohol use: No  . Drug use: No     Allergies   Cogentin [benztropine], Latuda [lurasidone hcl], Morphine and related, Orange fruit [citrus], Lamictal [lamotrigine], and Latex   Review of Systems Review of Systems  Constitutional: Negative for fatigue and fever.  Respiratory: Positive for shortness of breath. Negative for cough and wheezing.   Cardiovascular: Positive for chest pain. Negative for leg swelling.  Gastrointestinal: Positive for nausea. Negative for vomiting.  Neurological: Positive for dizziness. Negative for syncope.     Physical Exam Triage Vital Signs ED Triage Vitals [08/19/19 1034]  Enc Vitals Group     BP (!) 142/85     Pulse Rate 98     Resp      Temp 98.2 F (36.8 C)     Temp Source Oral     SpO2 99 %     Weight      Height      Head Circumference      Peak Flow      Pain Score      Pain Loc      Pain Edu?      Excl. in GC?    No data found.  Updated Vital  Signs BP (!) 142/85 (BP Location: Left Arm)   Pulse 98   Temp 98.2 F (36.8 C) (Oral)   LMP 08/14/2019   SpO2 99%    Physical Exam Constitutional:      General: She is in acute distress.     Appearance: She is well-developed. She is obese. She is not toxic-appearing or diaphoretic.     Comments: Patient visibly panicked, tremulous, needing redirection while answering some questions  HENT:     Head: Normocephalic and atraumatic.  Eyes:     General: No scleral icterus.    Pupils: Pupils are equal, round, and reactive to light.  Neck:     Vascular: No JVD.     Trachea: No tracheal deviation.  Cardiovascular:     Rate and Rhythm: Normal rate and regular rhythm.  Pulmonary:     Effort: Pulmonary effort is normal. No tachypnea.     Breath sounds: Normal breath sounds.     Comments: RR 22-30 during time in UC Skin:    Coloration: Skin is not cyanotic, jaundiced or pale.     Nails: There is no clubbing.   Neurological:     Mental Status: She is alert.  Psychiatric:        Mood and Affect: Mood is anxious.      UC Treatments / Results  Labs (all labs ordered are listed, but only abnormal results are displayed) Labs Reviewed - No data to display  EKG   Radiology   Procedures Procedures (including critical care time)  Medications Ordered in UC Medications - No data to display  Initial Impression / Assessment and Plan / UC Course  I have reviewed the triage vital signs and the nursing notes.  Pertinent labs & imaging results that were available during my care of the patient were reviewed by me and considered in my medical decision making (see chart for details).     33 year old female with history of anxiety, asthma, major depression with possible BPD presenting for sudden, severe chest pain waking her up from her sleep with associated dizziness and nausea.  Of note, patient was brought in to her room via wheelchair as she had lowered herself to  a seated position  on the floor in a panic state second to chest pain.  This provider was pulled from exam room with different patient at the time: Found patient to be in acute distress second to pain/panic-unable to perform cardiac rule out in UC setting.  EMS called for transport.  EKG done in office while waiting, reviewed by me without previous to compare: Normal sinus rhythm with right atrial enlargement, prolonged QTC.  Automatic reading showing pulmonary disease pattern, though this provider feels there is a lot of artifact second to patient's tremor.  EKG will need to be repeated in the ER once patient is calm, nontremulous.  Patient transported by EMS in stable condition. Final Clinical Impressions(s) / UC Diagnoses   Final diagnoses:  Chest pain, unspecified type   Discharge Instructions   None    ED Prescriptions    None     PDMP not reviewed this encounter.   Hall-Potvin, [place], [place] 08/19/19 1451

## 2019-08-19 NOTE — ED Notes (Signed)
Pt encouraged to stay. Refusing. Will not wait to speak to. IV removed. Pt seen leaving.

## 2019-08-19 NOTE — ED Triage Notes (Signed)
PT presents to [facility] for sudden onset of chest pain this morning that woke her from sleep.  Intermittent strong episodes with dizziness and nausea.  Patient c/o squeezing pressure to left chest and back.  Denies cardiac hx.  Patient lowered herself to sitting on the floor in the lobby when she had an episode while checking in.  Brought to room in wheelchair.

## 2020-07-12 ENCOUNTER — Ambulatory Visit
Admission: EM | Admit: 2020-07-12 | Discharge: 2020-07-12 | Disposition: A | Discharge status: Home / Self Care | Payer: Medicaid Other | Attending: Emergency Medicine | Admitting: Emergency Medicine

## 2020-07-12 DIAGNOSIS — L02411 Cutaneous abscess of right axilla: Secondary | ICD-10-CM

## 2020-07-12 MED ORDER — AMOXICILLIN-POT CLAVULANATE 875-125 MG PO TABS: 1.0000 | ORAL_TABLET | Freq: Two times a day (BID) | ORAL | 0 refills | Status: AC

## 2020-07-12 MED ORDER — FLUCONAZOLE 150 MG PO TABS: 150.0000 mg | ORAL_TABLET | Freq: Every day | ORAL | 0 refills | Status: AC

## 2020-07-12 NOTE — ED Triage Notes (Signed)
Pt present a abscess underneath her right armpit. Symptoms about 4 days ago. The area is painful to the touch and it is causing her right arm to be in pain.

## 2020-07-12 NOTE — ED Provider Notes (Signed)
[facility]    CSN: [id number] Arrival date & time: 07/12/20  1327      History   Chief Complaint Chief Complaint  Patient presents with  . Abscess    right arm    HPI [name] is a 34 y.o. female  Presenting for right axillary abscess.  States pain began about 4 days ago.  Endorsing swelling, warmth.  No fever, chest pain, difficulty breathing, active discharge.  Past Medical History:  Diagnosis Date  . Anxiety   . Asthma   . Fibromyalgia   . H/O exercise stress test 2009   nl echo also  [facility]  . H/O major depression 2008    poss bipolar  Dr Dub Mikes  . HPV in female     Patient Active Problem List   Diagnosis Date Noted  . H/O exercise stress test     Past Surgical History:  Procedure Laterality Date  . HERNIA REPAIR    . OVARIAN CYST REMOVAL    . WISDOM TOOTH EXTRACTION      OB History    Gravida  1   Para      Term      Preterm      AB  1   Living        SAB  1   TAB      Ectopic      Multiple      Live Births               Home Medications    Prior to Admission medications   Medication Sig Start Date End Date Taking? Authorizing Provider  acetaminophen (TYLENOL) 500 MG tablet Take 500 mg by mouth every 6 (six) hours as needed for pain.    [provider]  albuterol (PROVENTIL) (2.5 MG/3ML) 0.083% nebulizer solution Take 3 mLs (2.5 mg total) by nebulization every 6 (six) hours as needed for wheezing or shortness of breath. 05/12/19   Cathie Hoops, Amy V, PA-C  albuterol (VENTOLIN HFA) 108 (90 Base) MCG/ACT inhaler Inhale 1-2 puffs into the lungs every 6 (six) hours as needed for wheezing or shortness of breath. 05/12/19   Cathie Hoops, Amy V, PA-C  amoxicillin-clavulanate (AUGMENTIN) 875-125 MG tablet Take 1 tablet by mouth every 12 (twelve) hours for 5 days. 07/12/20 07/17/20  Hall-Potvin, [place], PA-C  diphenhydrAMINE (BENADRYL) 25 MG tablet Take 25 mg by mouth every 6 (six) hours as needed for itching.    [provider]  diphenhydrAMINE (BENADRYL) 25 MG tablet Take 1 tablet (25 mg total) by mouth every 6 (six) hours. 01/06/13   Hess, Nada Boozer, PA-C  fluconazole (DIFLUCAN) 150 MG tablet Take 1 tablet (150 mg total) by mouth daily. May repeat in 72 hours if needed 07/12/20   Hall-Potvin, [place], PA-C  meclizine (ANTIVERT) 25 MG tablet Take 1 tablet (25 mg total) by mouth 3 (three) times daily as needed for dizziness. 05/05/19   Mardella Layman, MD  OVER THE COUNTER MEDICATION Apply 1 application topically 2 (two) times daily as needed. For rash. Hydrocortisone cream    [provider]  permethrin (ELIMITE) 5 % cream Apply to affected area once 01/06/13   Hess, Nada Boozer, PA-C    Family History Family History  Problem Relation Age of Onset  . Cancer Mother     Social History Social History   Tobacco Use  . Smoking status: Former Smoker    Packs/day: 0.25  . Smokeless tobacco: Never Used  Substance Use  Topics  . Alcohol use: No  . Drug use: No     Allergies   Cogentin [benztropine], Latuda [lurasidone hcl], Morphine and related, Orange fruit [citrus], Lamictal [lamotrigine], and Latex   Review of Systems As per HPI   Physical Exam Triage Vital Signs ED Triage Vitals  Enc Vitals Group     BP 07/12/20 1428 119/70     Pulse Rate 07/12/20 1428 64     Resp 07/12/20 1428 16     Temp 07/12/20 1428 98.7 F (37.1 C)     Temp Source 07/12/20 1428 Oral     SpO2 07/12/20 1428 98 %     Weight --      Height --      Head Circumference --      Peak Flow --      Pain Score 07/12/20 1427 8     Pain Loc --      Pain Edu? --      Excl. in GC? --    No data found.  Updated Vital Signs BP 119/70 (BP Location: Left Arm)   Pulse 64   Temp 98.7 F (37.1 C) (Oral)   Resp 16   LMP 06/28/2020   SpO2 98%   Visual Acuity Right Eye Distance:   Left Eye Distance:   Bilateral Distance:    Right Eye Near:   Left Eye Near:    Bilateral Near:     Physical Exam Constitutional:        General: She is not in acute distress. HENT:     Head: Normocephalic and atraumatic.  Eyes:     General: No scleral icterus.    Pupils: Pupils are equal, round, and reactive to light.  Cardiovascular:     Rate and Rhythm: Normal rate.  Pulmonary:     Effort: Pulmonary effort is normal.  Skin:    Coloration: Skin is not jaundiced or pale.     Comments: L axilla w/ 2 cm abscess w/o drainage.  TTP  Neurological:     Mental Status: She is alert and oriented to person, place, and time.      UC Treatments / Results  Labs (all labs ordered are listed, but only abnormal results are displayed) Labs Reviewed - No data to display  EKG   Radiology No results found.  Procedures Incision and Drainage  Date/Time: 07/12/2020 4:11 PM Performed by: Shea Evans, PA-C Authorized by: Shea Evans, PA-C   Consent:    Consent obtained:  Verbal   Consent given by:  Patient   Risks discussed:  Bleeding, incomplete drainage, pain and damage to other organs   Alternatives discussed:  No treatment and alternative treatment Universal protocol:    Patient identity confirmed:  Verbally with patient Location:    Type:  Abscess   Size:  2 cm   Location:  Upper extremity   Upper extremity location: L axilla. Pre-procedure details:    Skin preparation:  Betadine Anesthesia (see MAR for exact dosages):    Anesthesia method:  Local infiltration   Local anesthetic:  Lidocaine 1% WITH epi Procedure type:    Complexity:  Complex Procedure details:    Needle aspiration: no     Incision types:  Single straight   Incision depth:  Subcutaneous   Scalpel blade:  11   Wound management:  Probed and deloculated, irrigated with saline and extensive cleaning   Drainage:  Purulent and bloody   Drainage amount:  Copious   Wound treatment:  Wound left open   Packing materials:  None Post-procedure details:    Patient tolerance of procedure:  Tolerated well, no immediate  complications Comments:     NVI pre/post proc   (including critical care time)  Medications Ordered in UC Medications - No data to display  Initial Impression / Assessment and Plan / UC Course  I have reviewed the triage vital signs and the nursing notes.  Pertinent labs & imaging results that were available during my care of the patient were reviewed by me and considered in my medical decision making (see chart for details).     Afebrile, nontoxic.  I&D performed which he tolerated well.  Due to extent of deeper abscess, loculations, will follow with antibiotics.  Return precautions discussed, pt verbalized understanding and is agreeable to plan. Final Clinical Impressions(s) / UC Diagnoses   Final diagnoses:  Abscess of axilla, right     Discharge Instructions     Keep area(s) clean and dry. Apply hot compress / towel for 5-10 minutes 3-5 times daily. Take antibiotic as prescribed with food - important to complete course. Return for worsening pain, redness, swelling, discharge, fever.  Helpful prevention tips: Keep nails short to avoid secondary skin infections. Use new, clean razors when shaving. Avoid antiperspirants - look for deodorants without aluminum. Avoid wearing underwire bras as this can irritate the area further.     ED Prescriptions    Medication Sig Dispense Auth. Provider   amoxicillin-clavulanate (AUGMENTIN) 875-125 MG tablet Take 1 tablet by mouth every 12 (twelve) hours for 5 days. 10 tablet Hall-Potvin, [place], PA-C   fluconazole (DIFLUCAN) 150 MG tablet Take 1 tablet (150 mg total) by mouth daily. May repeat in 72 hours if needed 2 tablet Hall-Potvin, [place], PA-C     PDMP not reviewed this encounter.   Hall-Potvin, [place], [place] 07/12/20 1612

## 2020-07-12 NOTE — Discharge Instructions (Addendum)
Keep area(s) clean and dry. °Apply hot compress / towel for 5-10 minutes 3-5 times daily. °Take antibiotic as prescribed with food - important to complete course. °Return for worsening pain, redness, swelling, discharge, fever. ° °Helpful prevention tips: °Keep nails short to avoid secondary skin infections. °Use new, clean razors when shaving. °Avoid antiperspirants - look for deodorants without aluminum. °Avoid wearing underwire bras as this can irritate the area further.  °

## 2020-10-29 ENCOUNTER — Other Ambulatory Visit: Payer: Self-pay

## 2020-10-29 ENCOUNTER — Ambulatory Visit
Admission: EM | Admit: 2020-10-29 | Discharge: 2020-10-29 | Disposition: A | Discharge status: Home / Self Care | Payer: Medicaid Other | Attending: Emergency Medicine | Admitting: Emergency Medicine

## 2020-10-29 ENCOUNTER — Encounter: Payer: Self-pay | Admitting: Emergency Medicine

## 2020-10-29 DIAGNOSIS — Z20822 Contact with and (suspected) exposure to covid-19: Secondary | ICD-10-CM

## 2020-10-29 MED ORDER — ALBUTEROL SULFATE (2.5 MG/3ML) 0.083% IN NEBU: 2.5000 mg | INHALATION_SOLUTION | Freq: Four times a day (QID) | RESPIRATORY_TRACT | 0 refills | Status: AC | PRN

## 2020-10-29 MED ORDER — PREDNISONE 50 MG PO TABS: 50.0000 mg | ORAL_TABLET | Freq: Every day | ORAL | 0 refills | Status: AC

## 2020-10-29 MED ORDER — BENZONATATE 200 MG PO CAPS: 200.0000 mg | ORAL_CAPSULE | Freq: Three times a day (TID) | ORAL | 0 refills | Status: AC | PRN

## 2020-10-29 MED ORDER — ALBUTEROL SULFATE HFA 108 (90 BASE) MCG/ACT IN AERS: 1.0000 | INHALATION_SPRAY | Freq: Four times a day (QID) | RESPIRATORY_TRACT | 0 refills | Status: AC | PRN

## 2020-10-29 NOTE — ED Provider Notes (Signed)
[facility]    CSN: [id number] Arrival date & time: 10/29/20  1412      History   Chief Complaint Chief Complaint  Patient presents with  . Cough  . Generalized Body Aches    HPI [name] is a 35 y.o. female presenting today for evaluation of cough and body aches.  Reports multiple family members at home with Covid.  Reports her asthma has been flaring has had increased shortness of breath and wheezing.  Using albuterol inhaler more recently.  HPI  Past Medical History:  Diagnosis Date  . Anxiety   . Asthma   . Fibromyalgia   . H/O exercise stress test 2009   nl echo also  [facility]  . H/O major depression 2008    poss bipolar  Dr Dub Mikes  . HPV in female     Patient Active Problem List   Diagnosis Date Noted  . H/O exercise stress test     Past Surgical History:  Procedure Laterality Date  . HERNIA REPAIR    . OVARIAN CYST REMOVAL    . WISDOM TOOTH EXTRACTION      OB History    Gravida  1   Para      Term      Preterm      AB  1   Living        SAB  1   IAB      Ectopic      Multiple      Live Births               Home Medications    Prior to Admission medications   Medication Sig Start Date End Date Taking? Authorizing Provider  albuterol (VENTOLIN HFA) 108 (90 Base) MCG/ACT inhaler Inhale 1-2 puffs into the lungs every 6 (six) hours as needed for wheezing or shortness of breath. 10/29/20  Yes Marieliz Strang C, PA-C  benzonatate (TESSALON) 200 MG capsule Take 1 capsule (200 mg total) by mouth 3 (three) times daily as needed for up to 7 days for cough. 10/29/20 11/05/20 Yes Keontae Levingston C, PA-C  predniSONE (DELTASONE) 50 MG tablet Take 1 tablet (50 mg total) by mouth daily with breakfast for 5 days. 10/29/20 11/03/20 Yes Lelynd Poer C, PA-C  acetaminophen (TYLENOL) 500 MG tablet Take 500 mg by mouth every 6 (six) hours as needed for pain.    [provider]  albuterol (PROVENTIL) (2.5 MG/3ML) 0.083% nebulizer  solution Take 3 mLs (2.5 mg total) by nebulization every 6 (six) hours as needed for wheezing or shortness of breath. 10/29/20   Lex Linhares C, PA-C  diphenhydrAMINE (BENADRYL) 25 MG tablet Take 25 mg by mouth every 6 (six) hours as needed for itching.    [provider]  diphenhydrAMINE (BENADRYL) 25 MG tablet Take 1 tablet (25 mg total) by mouth every 6 (six) hours. 01/06/13   Hess, Nada Boozer, PA-C  fluconazole (DIFLUCAN) 150 MG tablet Take 1 tablet (150 mg total) by mouth daily. May repeat in 72 hours if needed Patient not taking: Reported on 10/29/2020 07/12/20   Hall-Potvin, [place], PA-C  meclizine (ANTIVERT) 25 MG tablet Take 1 tablet (25 mg total) by mouth 3 (three) times daily as needed for dizziness. 05/05/19   Mardella Layman, MD  OVER THE COUNTER MEDICATION Apply 1 application topically 2 (two) times daily as needed. For rash. Hydrocortisone cream    [provider]  permethrin (ELIMITE) 5 % cream Apply to affected area  once Patient not taking: Reported on 10/29/2020 01/06/13   Kathrynn Speed, PA-C    Family History Family History  Problem Relation Age of Onset  . Cancer Mother     Social History Social History   Tobacco Use  . Smoking status: Former Smoker    Packs/day: 0.25  . Smokeless tobacco: Never Used  Substance Use Topics  . Alcohol use: No  . Drug use: No     Allergies   Cogentin [benztropine], Latuda [lurasidone hcl], Morphine and related, Orange fruit [citrus], Lamictal [lamotrigine], and Latex   Review of Systems Review of Systems  Constitutional: Negative for activity change, appetite change, chills, fatigue and fever.  HENT: Positive for congestion and rhinorrhea. Negative for ear pain, sinus pressure, sore throat and trouble swallowing.   Eyes: Negative for discharge and redness.  Respiratory: Positive for cough, chest tightness and shortness of breath.   Cardiovascular: Negative for chest pain.  Gastrointestinal: Negative for abdominal  pain, diarrhea, nausea and vomiting.  Musculoskeletal: Positive for myalgias.  Skin: Negative for rash.  Neurological: Negative for dizziness, light-headedness and headaches.     Physical Exam Triage Vital Signs ED Triage Vitals  Enc Vitals Group     BP 10/29/20 1607 129/86     Pulse Rate 10/29/20 1607 75     Resp 10/29/20 1607 16     Temp 10/29/20 1607 99.6 F (37.6 C)     Temp Source 10/29/20 1607 Oral     SpO2 10/29/20 1607 98 %     Weight --      Height --      Head Circumference --      Peak Flow --      Pain Score 10/29/20 1609 8     Pain Loc --      Pain Edu? --      Excl. in GC? --    No data found.  Updated Vital Signs BP 129/86 (BP Location: Left Arm)   Pulse 75   Temp 99.6 F (37.6 C) (Oral)   Resp 16   SpO2 98%   Visual Acuity Right Eye Distance:   Left Eye Distance:   Bilateral Distance:    Right Eye Near:   Left Eye Near:    Bilateral Near:     Physical Exam Vitals and nursing note reviewed.  Constitutional:      Appearance: She is well-developed and well-nourished.     Comments: No acute distress  HENT:     Head: Normocephalic and atraumatic.     Ears:     Comments: Bilateral ears without tenderness to palpation of external auricle, tragus and mastoid, EAC's without erythema or swelling, TM's with good bony landmarks and cone of light. Non erythematous.     Nose: Nose normal.     Mouth/Throat:     Comments: Oral mucosa pink and moist, no tonsillar enlargement or exudate. Posterior pharynx patent and nonerythematous, no uvula deviation or swelling. Normal phonation. Eyes:     Conjunctiva/sclera: Conjunctivae normal.  Cardiovascular:     Rate and Rhythm: Normal rate.  Pulmonary:     Effort: Pulmonary effort is normal. No respiratory distress.     Comments: Breathing comfortably at rest, CTABL, no wheezing, rales or other adventitious sounds auscultated Abdominal:     General: There is no distension.  Musculoskeletal:        General:  Normal range of motion.     Cervical back: Neck supple.  Skin:    General: Skin  is warm and dry.  Neurological:     Mental Status: She is alert and oriented to person, place, and time.  Psychiatric:        Mood and Affect: Mood and affect normal.      UC Treatments / Results  Labs (all labs ordered are listed, but only abnormal results are displayed) Labs Reviewed  NOVEL CORONAVIRUS, NAA    EKG   Radiology No results found.  Procedures Procedures (including critical care time)  Medications Ordered in UC Medications - No data to display  Initial Impression / Assessment and Plan / UC Course  I have reviewed the triage vital signs and the nursing notes.  Pertinent labs & imaging results that were available during my care of the patient were reviewed by me and considered in my medical decision making (see chart for details).     Suspected Covid infection given exposure at home, Covid test pending.  Recommending symptomatic and supportive care, refilled albuterol nebulizers and inhaler along with course of prednisone to help with asthma, Tessalon for cough.  Rest and fluids.  Continue to monitor,Discussed strict return precautions. Patient verbalized understanding and is agreeable with plan.  Final Clinical Impressions(s) / UC Diagnoses   Final diagnoses:  Encounter for screening laboratory testing for COVID-19 virus  Suspected COVID-19 virus infection     Discharge Instructions     Covid test pending, monitor my chart for results Continue albuterol inhaler nebulizers as needed for shortness of breath chest tightness and wheezing Tessalon for cough Begin prednisone daily for 5 days Follow-up if any symptoms not improving or worsening    ED Prescriptions    Medication Sig Dispense Auth. Provider   albuterol (VENTOLIN HFA) 108 (90 Base) MCG/ACT inhaler Inhale 1-2 puffs into the lungs every 6 (six) hours as needed for wheezing or shortness of breath. 18 g Nesbit Michon,  Barnaby Rippeon C, PA-C   albuterol (PROVENTIL) (2.5 MG/3ML) 0.083% nebulizer solution Take 3 mLs (2.5 mg total) by nebulization every 6 (six) hours as needed for wheezing or shortness of breath. 75 mL Malin Sambrano C, PA-C   benzonatate (TESSALON) 200 MG capsule Take 1 capsule (200 mg total) by mouth 3 (three) times daily as needed for up to 7 days for cough. 28 capsule Shereena Berquist C, PA-C   predniSONE (DELTASONE) 50 MG tablet Take 1 tablet (50 mg total) by mouth daily with breakfast for 5 days. 5 tablet Mena Lienau, [place] C, PA-C     PDMP not reviewed this encounter.   Sharyon Cable [place] C, [place] 10/30/20 [phone]

## 2020-10-29 NOTE — ED Triage Notes (Signed)
Pt here for cough and body aches; pt has multiple people in household with covid

## 2020-10-29 NOTE — Discharge Instructions (Signed)
Covid test pending, monitor my chart for results Continue albuterol inhaler nebulizers as needed for shortness of breath chest tightness and wheezing Tessalon for cough Begin prednisone daily for 5 days Follow-up if any symptoms not improving or worsening

## 2020-11-01 LAB — NOVEL CORONAVIRUS, NAA: SARS-CoV-2, NAA: NOT DETECTED

## 2021-10-02 IMAGING — DX DG CHEST 2V
2 series · 2 of 2 positions shown · non-contrast
Comparison: 09/14/2009

CLINICAL DATA: Chest pain

EXAM:
CHEST - 2 VIEW

[w chest pa]
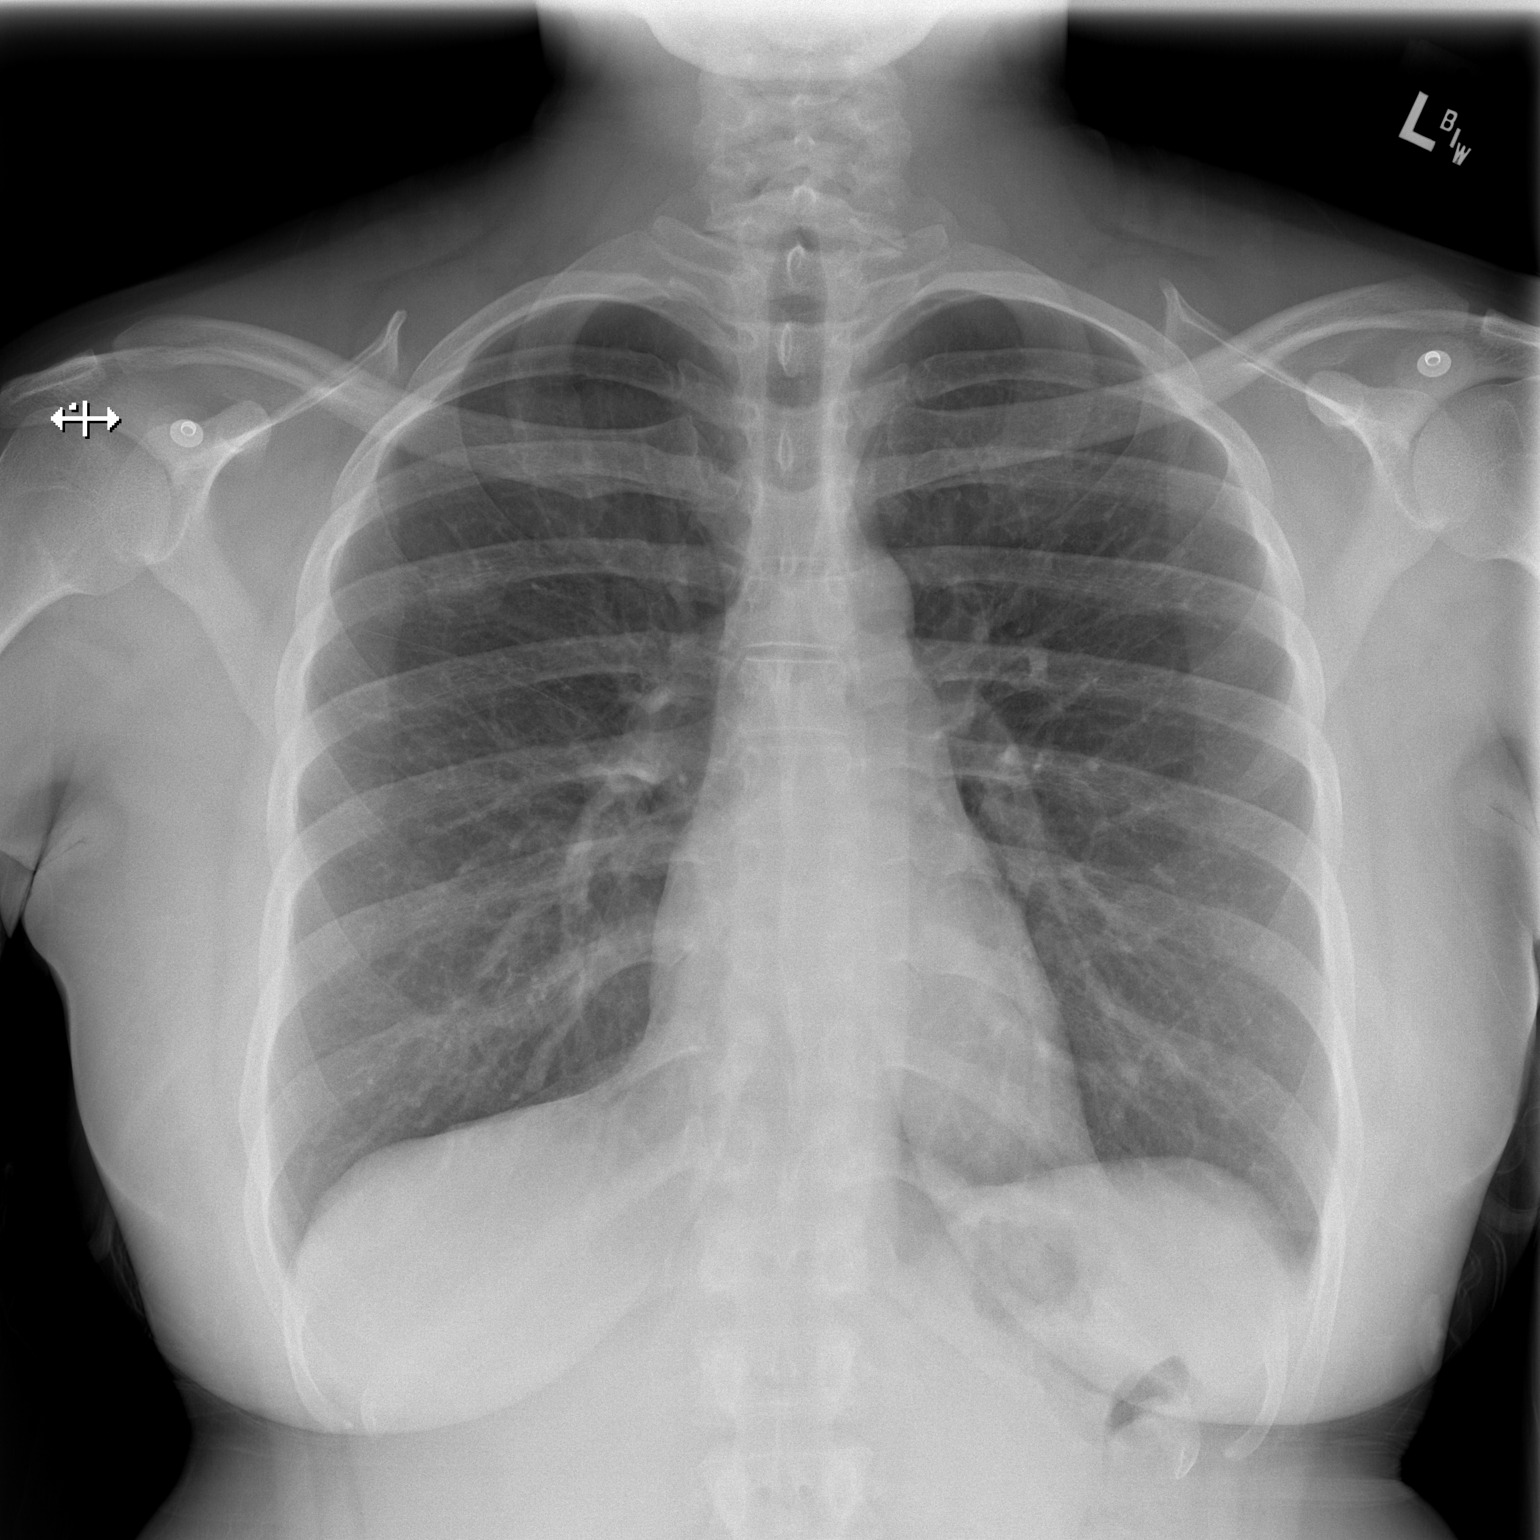

[w chest lat]
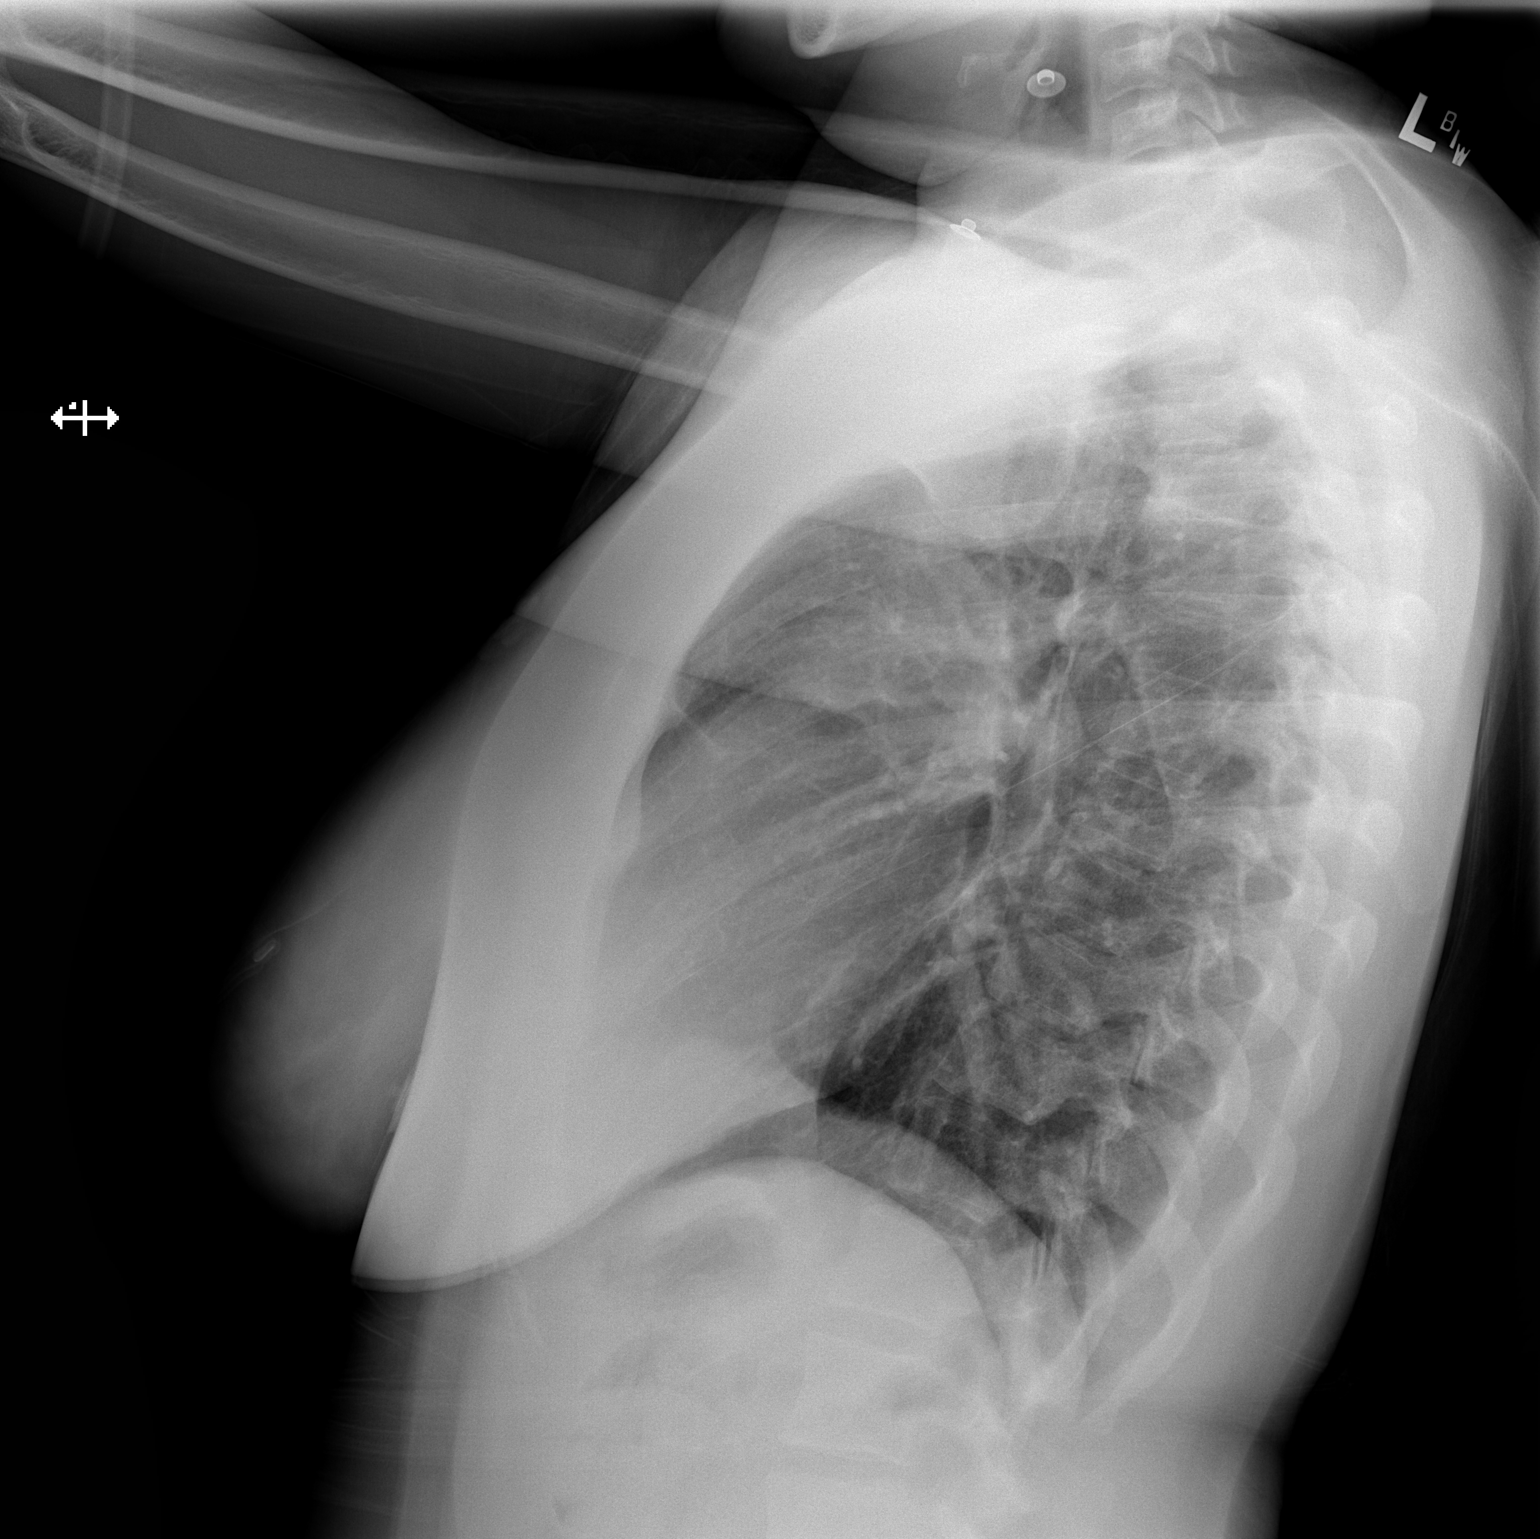

[2 of 2 positions shown; findings below may reference images not displayed]

FINDINGS: The heart size and mediastinal contours are within normal limits.
Both lungs are clear. The visualized skeletal structures are
unremarkable.
IMPRESSION: No acute abnormality of the lungs.
